# Patient Record
Sex: Female | Born: 1955 | Race: Black or African American | Hispanic: No | Marital: Single | Smoking: Never smoker
Health system: Southern US, Community
[De-identification: ages and names within clinical notes are randomized; demographics above are authoritative.]

## PROBLEM LIST (undated history)

## (undated) DIAGNOSIS — Z853 Personal history of malignant neoplasm of breast: Secondary | ICD-10-CM

## (undated) DIAGNOSIS — Z803 Family history of malignant neoplasm of breast: Secondary | ICD-10-CM

## (undated) HISTORY — DX: Personal history of malignant neoplasm of breast: Z85.3

## (undated) HISTORY — PX: MASTECTOMY: SHX3

## (undated) HISTORY — DX: Family history of malignant neoplasm of breast: Z80.3

---

## 2015-01-23 ENCOUNTER — Encounter (HOSPITAL_COMMUNITY): Payer: Self-pay | Admitting: Emergency Medicine

## 2015-01-23 ENCOUNTER — Emergency Department (HOSPITAL_COMMUNITY)
Admission: EM | Admit: 2015-01-23 | Discharge: 2015-01-24 | Disposition: A | Discharge status: Home / Self Care | Payer: BLUE CROSS/BLUE SHIELD | Attending: Emergency Medicine | Admitting: Emergency Medicine

## 2015-01-23 ENCOUNTER — Emergency Department (HOSPITAL_COMMUNITY): Payer: BLUE CROSS/BLUE SHIELD

## 2015-01-23 DIAGNOSIS — S0083XA Contusion of other part of head, initial encounter: Secondary | ICD-10-CM | POA: Diagnosis not present

## 2015-01-23 DIAGNOSIS — Y999 Unspecified external cause status: Secondary | ICD-10-CM | POA: Insufficient documentation

## 2015-01-23 DIAGNOSIS — Y9389 Activity, other specified: Secondary | ICD-10-CM | POA: Diagnosis not present

## 2015-01-23 DIAGNOSIS — Y92410 Unspecified street and highway as the place of occurrence of the external cause: Secondary | ICD-10-CM | POA: Insufficient documentation

## 2015-01-23 DIAGNOSIS — S0990XA Unspecified injury of head, initial encounter: Secondary | ICD-10-CM | POA: Diagnosis present
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
## 2015-01-24 MED ORDER — HYDROCODONE-ACETAMINOPHEN 5-325 MG PO TABS: 1.0000 | ORAL_TABLET | Freq: Four times a day (QID) | ORAL | Status: DC | PRN

## 2015-01-24 NOTE — Discharge Instructions (Signed)
Return here as needed. Follow up with your doctor. The CT scans only showed a hematoma of your face.
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
## 2021-07-25 ENCOUNTER — Other Ambulatory Visit: Payer: Self-pay

## 2021-07-25 ENCOUNTER — Emergency Department (HOSPITAL_COMMUNITY)
Admission: EM | Admit: 2021-07-25 | Discharge: 2021-07-25 | Disposition: A | Discharge status: Home / Self Care | Payer: BC Managed Care – PPO | Attending: Emergency Medicine | Admitting: Emergency Medicine

## 2021-07-25 ENCOUNTER — Emergency Department (HOSPITAL_COMMUNITY): Payer: BC Managed Care – PPO

## 2021-07-25 ENCOUNTER — Encounter (HOSPITAL_COMMUNITY): Payer: Self-pay | Admitting: Emergency Medicine

## 2021-07-25 DIAGNOSIS — S6992XA Unspecified injury of left wrist, hand and finger(s), initial encounter: Secondary | ICD-10-CM | POA: Diagnosis present

## 2021-07-25 DIAGNOSIS — Y92410 Unspecified street and highway as the place of occurrence of the external cause: Secondary | ICD-10-CM | POA: Insufficient documentation

## 2021-07-25 DIAGNOSIS — S65212A Laceration of superficial palmar arch of left hand, initial encounter: Secondary | ICD-10-CM | POA: Diagnosis not present

## 2021-07-25 DIAGNOSIS — S61412A Laceration without foreign body of left hand, initial encounter: Secondary | ICD-10-CM

## 2021-07-25 DIAGNOSIS — Z23 Encounter for immunization: Secondary | ICD-10-CM | POA: Diagnosis not present

## 2021-07-25 DIAGNOSIS — M25511 Pain in right shoulder: Secondary | ICD-10-CM | POA: Diagnosis not present

## 2021-07-25 MED ORDER — NAPROXEN 500 MG PO TABS: 500.0000 mg | ORAL_TABLET | Freq: Two times a day (BID) | ORAL | 0 refills | Status: DC

## 2021-07-25 MED ORDER — METHOCARBAMOL 500 MG PO TABS
500.0000 mg | ORAL_TABLET | Freq: Once | ORAL | Status: AC
  Administered 2021-07-25: 500 mg via ORAL
  Filled 2021-07-25: qty 1

## 2021-07-25 MED ORDER — LIDOCAINE HCL (PF) 1 % IJ SOLN
5.0000 mL | Freq: Once | INTRAMUSCULAR | Status: AC
  Administered 2021-07-25: 5 mL
  Filled 2021-07-25: qty 30

## 2021-07-25 MED ORDER — METHOCARBAMOL 500 MG PO TABS: 500.0000 mg | ORAL_TABLET | Freq: Two times a day (BID) | ORAL | 0 refills | Status: DC

## 2021-07-25 MED ORDER — LIDOCAINE-EPINEPHRINE-TETRACAINE (LET) TOPICAL GEL
3.0000 mL | Freq: Once | TOPICAL | Status: AC
  Administered 2021-07-25: 3 mL via TOPICAL
  Filled 2021-07-25: qty 3

## 2021-07-25 MED ORDER — TETANUS-DIPHTH-ACELL PERTUSSIS 5-2.5-18.5 LF-MCG/0.5 IM SUSY
0.5000 mL | PREFILLED_SYRINGE | Freq: Once | INTRAMUSCULAR | Status: AC
  Administered 2021-07-25: 0.5 mL via INTRAMUSCULAR
  Filled 2021-07-25: qty 0.5

## 2021-07-25 MED ORDER — LIDOCAINE 5 % EX PTCH: 1.0000 | MEDICATED_PATCH | CUTANEOUS | 0 refills | Status: DC

## 2021-07-25 NOTE — Progress Notes (Signed)
Orthopedic Tech Progress Note ?Patient Details:  ?Iliyah Bui ?1955-06-05 ?[id number] ? ?Ortho Devices ?Type of Ortho Device: Sling immobilizer ?Ortho Device/Splint Location: right ?Ortho Device/Splint Interventions: Application ?  ?Post Interventions ?Patient Tolerated: Well ?Instructions Provided: Care of device ? ?Saul Fordyce ?07/25/2021, 6:25 PM ? ?
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
## 2024-02-12 ENCOUNTER — Emergency Department (HOSPITAL_COMMUNITY)
Admission: EM | Admit: 2024-02-12 | Discharge: 2024-02-12 | Disposition: A | Discharge status: Home / Self Care | Attending: Emergency Medicine | Admitting: Emergency Medicine

## 2024-02-12 ENCOUNTER — Other Ambulatory Visit: Payer: Self-pay

## 2024-02-12 DIAGNOSIS — N644 Mastodynia: Secondary | ICD-10-CM | POA: Diagnosis present

## 2024-02-12 DIAGNOSIS — N61 Mastitis without abscess: Secondary | ICD-10-CM | POA: Diagnosis not present

## 2024-02-12 LAB — CBC WITH DIFFERENTIAL/PLATELET
Abs Immature Granulocytes: 0.03 K/uL (ref 0.00–0.07)
Basophils Absolute: 0 K/uL (ref 0.0–0.1)
Basophils Relative: 1 %
Eosinophils Absolute: 0.1 K/uL (ref 0.0–0.5)
Eosinophils Relative: 2 %
HCT: 37.2 % (ref 36.0–46.0)
Hemoglobin: 11.3 g/dL — ABNORMAL LOW (ref 12.0–15.0)
Immature Granulocytes: 0 %
Lymphocytes Relative: 25 %
Lymphs Abs: 2 K/uL (ref 0.7–4.0)
MCH: 22.5 pg — ABNORMAL LOW (ref 26.0–34.0)
MCHC: 30.4 g/dL (ref 30.0–36.0)
MCV: 74 fL — ABNORMAL LOW (ref 80.0–100.0)
Monocytes Absolute: 0.7 K/uL (ref 0.1–1.0)
Monocytes Relative: 9 %
Neutro Abs: 5 K/uL (ref 1.7–7.7)
Neutrophils Relative %: 63 %
Platelets: 184 K/uL (ref 150–400)
RBC: 5.03 MIL/uL (ref 3.87–5.11)
RDW: 17.1 % — ABNORMAL HIGH (ref 11.5–15.5)
WBC: 7.9 K/uL (ref 4.0–10.5)
nRBC: 0 % (ref 0.0–0.2)

## 2024-02-12 LAB — COMPREHENSIVE METABOLIC PANEL WITH GFR
ALT: 13 U/L (ref 0–44)
AST: 38 U/L (ref 15–41)
Albumin: 3.9 g/dL (ref 3.5–5.0)
Alkaline Phosphatase: 59 U/L (ref 38–126)
Anion gap: 13 (ref 5–15)
BUN: 22 mg/dL (ref 8–23)
CO2: 21 mmol/L — ABNORMAL LOW (ref 22–32)
Calcium: 8.8 mg/dL — ABNORMAL LOW (ref 8.9–10.3)
Chloride: 105 mmol/L (ref 98–111)
Creatinine, Ser: 0.76 mg/dL (ref 0.44–1.00)
GFR, Estimated: >60 mL/min (ref >60)
Glucose, Bld: 97 mg/dL (ref 70–99)
Potassium: 4.6 mmol/L (ref 3.5–5.1)
Sodium: 138 mmol/L (ref 135–145)
Total Bilirubin: 0.6 mg/dL (ref 0.0–1.2)
Total Protein: 7.4 g/dL (ref 6.5–8.1)

## 2024-02-12 MED ORDER — SODIUM CHLORIDE 0.9 % IV SOLN
1.0000 g | Freq: Once | INTRAVENOUS | Status: AC
  Administered 2024-02-12: 1 g via INTRAVENOUS
  Filled 2024-02-12: qty 10

## 2024-02-12 MED ORDER — DOXYCYCLINE HYCLATE 100 MG PO CAPS: 100.0000 mg | ORAL_CAPSULE | Freq: Two times a day (BID) | ORAL | 0 refills | Status: DC

## 2024-02-12 NOTE — ED Triage Notes (Signed)
 Pt reports left breast pain and swelling. Pt reports falling August 20th and was seen at [facility] but did not have the breast pain at that time.

## 2024-02-12 NOTE — Discharge Instructions (Signed)
 Return if any problems.  Schedule to see Dr. Ebbie for evaluation.  Call the breast center to be seen for evaluation

## 2024-02-12 NOTE — ED Provider Notes (Signed)
  EMERGENCY DEPARTMENT AT [facility] Provider Note   CSN: [id number] Arrival date & time: 02/12/24  1849     Patient presents with: Breast Pain   Whitney Villegas is a 68 y.o. female.   Patient complains of swelling and discomfort to her left breast.  Patient states that she noticed swelling a week ago.  Patient reports that she had a fall in August but struck the right side of her chest.  Patient complains of redness to the left breast.  Patient states that she has not had any fever or chills.  Patient has had a right sided mastectomy.  Patient states she had this in her 45s.  Patient reports having normal mammograms.  She believes her last mammogram was a year ago.  The history is provided by the patient. No language interpreter was used.       Prior to Admission medications   Medication Sig Start Date End Date Taking? Authorizing Provider  doxycycline  (VIBRAMYCIN ) 100 MG capsule Take 1 capsule (100 mg total) by mouth 2 (two) times daily. 02/12/24  Yes Trayvon Trumbull K, PA-C  HYDROcodone -acetaminophen  (NORCO/VICODIN) 5-325 MG per tablet Take 1 tablet by mouth every 6 (six) hours as needed for moderate pain. 01/24/15   Lawyer, Lonni, PA-C  ibuprofen (ADVIL,MOTRIN) 200 MG tablet Take 600 mg by mouth every 6 (six) hours as needed for moderate pain.    [provider]  lidocaine  (LIDODERM ) 5 % Place 1 patch onto the skin daily. Remove & Discard patch within 12 hours or as directed by MD 07/25/21   Henderly, Britni A, PA-C  methocarbamol  (ROBAXIN ) 500 MG tablet Take 1 tablet (500 mg total) by mouth 2 (two) times daily. 07/25/21   Henderly, Britni A, PA-C  naproxen  (NAPROSYN ) 500 MG tablet Take 1 tablet (500 mg total) by mouth 2 (two) times daily. 07/25/21   Henderly, Britni A, PA-C    Allergies: Patient has no known allergies.    Review of Systems  Constitutional:  Negative for fever.  HENT:  Negative for sore throat.   All other systems reviewed and are  negative.   Updated Vital Signs BP 136/75   Pulse 65   Temp 97.8 F (36.6 C) (Oral)   Resp 18   SpO2 100%   Physical Exam Vitals and nursing note reviewed.  Constitutional:      Appearance: She is well-developed.  HENT:     Head: Normocephalic.  Cardiovascular:     Rate and Rhythm: Normal rate.  Pulmonary:     Effort: Pulmonary effort is normal.  Abdominal:     General: There is no distension.  Musculoskeletal:        General: Normal range of motion.     Cervical back: Normal range of motion.  Skin:    General: Skin is warm.     Comments: Erythematous edematous left breast swelling to nipple area, large firm area to palpation.   Neurological:     General: No focal deficit present.     Mental Status: She is alert and oriented to person, place, and time.     (all labs ordered are listed, but only abnormal results are displayed) Labs Reviewed  CBC WITH DIFFERENTIAL/PLATELET - Abnormal; Notable for the following components:      Result Value   Hemoglobin 11.3 (*)    MCV 74.0 (*)    MCH 22.5 (*)    RDW 17.1 (*)    All other components within normal limits  COMPREHENSIVE  METABOLIC PANEL WITH GFR - Abnormal; Notable for the following components:   CO2 21 (*)    Calcium 8.8 (*)    All other components within normal limits    EKG: None  Radiology: No results found.   Procedures   Medications Ordered in the ED  cefTRIAXone  (ROCEPHIN ) 1 g in sodium chloride  0.9 % 100 mL IVPB (0 g Intravenous Stopped 02/12/24 2117)                                    Medical Decision Making Amount and/or Complexity of Data Reviewed Labs: ordered.    Details: Labs ordered reviewed and interpreted hemoglobin is 11.3 white blood cell count is normal  Risk Prescription drug management. Risk Details: Patient given IV Rocephin .  Patient given  doxycycline .  I sent a message to Dr. Ebbie who agrees to see patient for follow-up.  Patient is given Dr. Gelene phone number as  well as the number for the breast center.  She is advised to return to the emergency department if symptoms worsen or change.        Final diagnoses:  Cellulitis of breast    ED Discharge Orders          Ordered    doxycycline  (VIBRAMYCIN ) 100 MG capsule  2 times daily        02/12/24 2150           An After Visit Summary was printed and given to the patient.     Flint Sonny POUR, PA-C 02/12/24 2303    Bari Roxie HERO, DO 02/20/24 2238

## 2024-02-13 ENCOUNTER — Other Ambulatory Visit: Payer: Self-pay | Admitting: General Surgery

## 2024-02-13 DIAGNOSIS — N611 Abscess of the breast and nipple: Secondary | ICD-10-CM

## 2024-02-15 ENCOUNTER — Other Ambulatory Visit: Payer: Self-pay | Admitting: General Surgery

## 2024-02-15 ENCOUNTER — Ambulatory Visit
Admission: RE | Admit: 2024-02-15 | Discharge: 2024-02-15 | Disposition: A | Discharge status: Home / Self Care | Source: Ambulatory Visit | Attending: General Surgery | Admitting: General Surgery

## 2024-02-15 ENCOUNTER — Ambulatory Visit
Admission: RE | Admit: 2024-02-15 | Discharge: 2024-02-15 | Disposition: A | Discharge status: Home / Self Care | Payer: Medicare (Managed Care) | Source: Ambulatory Visit | Attending: General Surgery | Admitting: General Surgery

## 2024-02-15 DIAGNOSIS — N6320 Unspecified lump in the left breast, unspecified quadrant: Secondary | ICD-10-CM

## 2024-02-15 DIAGNOSIS — N611 Abscess of the breast and nipple: Secondary | ICD-10-CM

## 2024-02-15 HISTORY — PX: BREAST BIOPSY: SHX20

## 2024-02-16 ENCOUNTER — Telehealth: Payer: Self-pay | Admitting: Hematology and Oncology

## 2024-02-16 LAB — SURGICAL PATHOLOGY

## 2024-02-16 NOTE — Telephone Encounter (Signed)
 Scheduled appointments per referral. Talked with the patient and she is aware of the appointment time and date as well as the address. Patient was informed to arrive 10-15 minutes prior with updated insurance information. Patient was also informed that two guests are permitted but needs to be at least 69 years of age. All questions were answered.

## 2024-02-19 ENCOUNTER — Other Ambulatory Visit: Payer: Self-pay | Admitting: General Surgery

## 2024-02-20 ENCOUNTER — Other Ambulatory Visit: Payer: Self-pay | Admitting: *Deleted

## 2024-02-20 ENCOUNTER — Other Ambulatory Visit: Payer: Self-pay | Admitting: General Surgery

## 2024-02-20 DIAGNOSIS — C50912 Malignant neoplasm of unspecified site of left female breast: Secondary | ICD-10-CM

## 2024-02-20 DIAGNOSIS — C50512 Malignant neoplasm of lower-outer quadrant of left female breast: Secondary | ICD-10-CM | POA: Insufficient documentation

## 2024-02-21 ENCOUNTER — Other Ambulatory Visit

## 2024-02-21 ENCOUNTER — Inpatient Hospital Stay: Payer: Medicare (Managed Care) | Admitting: Genetic Counselor

## 2024-02-21 ENCOUNTER — Inpatient Hospital Stay: Payer: Medicare (Managed Care)

## 2024-02-21 ENCOUNTER — Other Ambulatory Visit: Payer: Self-pay | Admitting: Genetic Counselor

## 2024-02-21 ENCOUNTER — Inpatient Hospital Stay: Payer: Medicare (Managed Care) | Attending: Hematology and Oncology | Admitting: Hematology and Oncology

## 2024-02-21 ENCOUNTER — Encounter: Payer: Self-pay | Admitting: Hematology and Oncology

## 2024-02-21 ENCOUNTER — Encounter

## 2024-02-21 VITALS — BP 143/66 | HR 77 | Temp 97.8°F | Resp 18 | Wt 242.8 lb

## 2024-02-21 DIAGNOSIS — C773 Secondary and unspecified malignant neoplasm of axilla and upper limb lymph nodes: Secondary | ICD-10-CM | POA: Insufficient documentation

## 2024-02-21 DIAGNOSIS — Z5189 Encounter for other specified aftercare: Secondary | ICD-10-CM | POA: Diagnosis not present

## 2024-02-21 DIAGNOSIS — Z9011 Acquired absence of right breast and nipple: Secondary | ICD-10-CM | POA: Diagnosis not present

## 2024-02-21 DIAGNOSIS — Z803 Family history of malignant neoplasm of breast: Secondary | ICD-10-CM | POA: Insufficient documentation

## 2024-02-21 DIAGNOSIS — Z853 Personal history of malignant neoplasm of breast: Secondary | ICD-10-CM

## 2024-02-21 DIAGNOSIS — Z79630 Long term (current) use of alkylating agent: Secondary | ICD-10-CM | POA: Insufficient documentation

## 2024-02-21 DIAGNOSIS — Z171 Estrogen receptor negative status [ER-]: Secondary | ICD-10-CM

## 2024-02-21 DIAGNOSIS — Z5111 Encounter for antineoplastic chemotherapy: Secondary | ICD-10-CM | POA: Insufficient documentation

## 2024-02-21 DIAGNOSIS — Z1732 Human epidermal growth factor receptor 2 negative status: Secondary | ICD-10-CM

## 2024-02-21 DIAGNOSIS — C50812 Malignant neoplasm of overlapping sites of left female breast: Secondary | ICD-10-CM

## 2024-02-21 DIAGNOSIS — Z79620 Long term (current) use of immunosuppressive biologic: Secondary | ICD-10-CM | POA: Insufficient documentation

## 2024-02-21 DIAGNOSIS — Z5112 Encounter for antineoplastic immunotherapy: Secondary | ICD-10-CM | POA: Insufficient documentation

## 2024-02-21 DIAGNOSIS — Z17421 Hormone receptor negative with human epidermal growth factor receptor 2 negative status: Secondary | ICD-10-CM | POA: Diagnosis not present

## 2024-02-21 DIAGNOSIS — Z1722 Progesterone receptor negative status: Secondary | ICD-10-CM | POA: Diagnosis not present

## 2024-02-21 DIAGNOSIS — C50512 Malignant neoplasm of lower-outer quadrant of left female breast: Secondary | ICD-10-CM

## 2024-02-21 LAB — GENETIC SCREENING ORDER

## 2024-02-21 NOTE — Progress Notes (Signed)
  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 Suspected inflammatory sub type due to redness and swelling. - Order breast MRI to assess tumor and lymph node involvement. - Order scans to evaluate for metastasis. - Schedule echocardiogram to assess cardiac function prior to chemotherapy. - Plan neoadjuvant chemotherapy with KEYNOTE 522 regimen, contingent on health status and scan results. - Delay port placement until after scan results confirm treatment plan. - Coordinate scans at [facility] per her preference.  Right breast cancer, status post mastectomy and chemotherapy Treated with mastectomy and single chemotherapy session 43 years ago. Details of past treatment unclear.  Genetic counseling Genetic testing considered due to potential second primary breast cancer. - Coordinate with genetic counseling for potential genetic testing.   HISTORY OF PRESENTING ILLNESS:  Whitney Villegas 68 y.o. female is here because of triple neg Breast cancer.  Discussed the use of AI scribe software for clinical note transcription with the patient, who gave verbal consent to proceed.  History of Present Illness Whitney Villegas is a 68 year old female who presents with swelling and hardness of the left breast. She was referred by Dr. Ebbie for evaluation of her breast condition.  She experienced swelling and hardness of the left breast following an incident where a trash can hit her chest. Initially, she had pain on the right side, but three days later, significant swelling developed in the left breast. She sought care at urgent care and received pain medication and cream. Subsequently, she visited the emergency  room where she was administered intravenous antibiotics and prescribed oral antibiotics.  Despite completing the antibiotic treatment, the swelling persists. Prior to the incident, she did not notice any abnormalities in her breast, which she checks regularly.  Her past medical history includes a mastectomy 43 years ago for a lump in the right breast, during which she received one chemotherapy treatment. She believes the mastectomy was performed by mistake as she was initially told only the lump would be removed. She does not recall her last mammogram but estimates it was about five years ago and has no history of regular mammograms.  She currently takes naproxen  for pain and has completed a course of antibiotics. She does not take any other regular medications. No significant medical conditions such as diabetes, high blood pressure, heart disease, or kidney problems.  Family history includes her sister having breast cancer at approximately age 52. Her daughter passed away from a heart attack at age 70, with a history of fluid buildup and heart failure.  She lives alone in [place] and works 36 hours a week. She is active, participating in church activities and catering. Her son lives in [place] and can assist her if needed.   All other systems were reviewed with the patient and are negative.  MEDICAL HISTORY:  No past medical history on file.  SURGICAL HISTORY: Past Surgical History:  Procedure Laterality Date   BREAST BIOPSY Left 02/15/2024   US  LT BREAST BX W LOC DEV 1ST LESION IMG BX SPEC US  GUIDE 02/15/2024 [facility] MAMMOGRAPHY   MASTECTOMY      SOCIAL HISTORY: Social History   Socioeconomic History   Marital status: Single    Spouse name: Not on file  Number of children: Not on file   Years of education: Not on file   Highest education level: Not on file  Occupational History   Not on file  Tobacco Use   Smoking status: Never   Smokeless tobacco: Not on file  Substance  and Sexual Activity   Alcohol use: No   Drug use: Not on file   Sexual activity: Not on file  Other Topics Concern   Not on file  Social History Narrative   Not on file   Social Drivers of Health   Financial Resource Strain: Not on file  Food Insecurity: Not on file  Transportation Needs: Not on file  Physical Activity: Not on file  Stress: Not on file  Social Connections: Not on file  Intimate Partner Violence: Not on file    FAMILY HISTORY: Sister had BC at 72. Personal history of ? Breast cancer in 20's  ALLERGIES:  has no known allergies.  MEDICATIONS:  Current Outpatient Medications  Medication Sig Dispense Refill   doxycycline  (VIBRAMYCIN ) 100 MG capsule Take 1 capsule (100 mg total) by mouth 2 (two) times daily. 20 capsule 0   HYDROcodone -acetaminophen  (NORCO/VICODIN) 5-325 MG per tablet Take 1 tablet by mouth every 6 (six) hours as needed for moderate pain. 15 tablet 0   ibuprofen (ADVIL,MOTRIN) 200 MG tablet Take 600 mg by mouth every 6 (six) hours as needed for moderate pain.     lidocaine  (LIDODERM ) 5 % Place 1 patch onto the skin daily. Remove & Discard patch within 12 hours or as directed by MD 30 patch 0   methocarbamol  (ROBAXIN ) 500 MG tablet Take 1 tablet (500 mg total) by mouth 2 (two) times daily. 20 tablet 0   naproxen  (NAPROSYN ) 500 MG tablet Take 1 tablet (500 mg total) by mouth 2 (two) times daily. 30 tablet 0   No current facility-administered medications for this visit.     PHYSICAL EXAMINATION: ECOG PERFORMANCE STATUS: 1 - Symptomatic but completely ambulatory  Vitals:   02/21/24 1137  BP: (!) 143/66  Pulse: 77  Resp: 18  Temp: 97.8 F (36.6 C)  SpO2: 99%   Filed Weights   02/21/24 1137  Weight: 242 lb 12.8 oz (110.1 kg)    GENERAL:alert, no distress and comfortable Large mass in the left breast occupying the lower portion of the breast with diffuse swelling all over the breast. Palpable left axillary adenopathy, not matted. CTA  bilaterally RRR No LE edema.  LABORATORY DATA:  I have reviewed the data as listed Lab Results  Component Value Date   WBC 7.9 02/12/2024   HGB 11.3 (L) 02/12/2024   HCT 37.2 02/12/2024   MCV 74.0 (L) 02/12/2024   PLT 184 02/12/2024     Chemistry      Component Value Date/Time   NA 138 02/12/2024 2017   K 4.6 02/12/2024 2017   CL 105 02/12/2024 2017   CO2 21 (L) 02/12/2024 2017   BUN 22 02/12/2024 2017   CREATININE 0.76 02/12/2024 2017      Component Value Date/Time   CALCIUM 8.8 (L) 02/12/2024 2017   ALKPHOS 59 02/12/2024 2017   AST 38 02/12/2024 2017   ALT 13 02/12/2024 2017   BILITOT 0.6 02/12/2024 2017       RADIOGRAPHIC STUDIES: I have personally reviewed the radiological images as listed and agreed with the findings in the report. US  AXILLARY NODE CORE BIOPSY LEFT Addendum Date: 02/16/2024 ADDENDUM REPORT: 02/16/2024 13:29 ADDENDUM: PATHOLOGY revealed: Site 1. Breast, left,  needle core biopsy, 6 o'clock 3.7 cm masslike area INVASIVE MAMMARY CARCINOMA, OVERALL GRADE: 3; LYMPHOVASCULAR INVASION: NOT IDENTIFIED CANCER LENGTH: 0.9 CM CALCIFICATIONS: NOT IDENTIFIED OTHER FINDINGS: NONE Pathology results are CONCORDANT with imaging findings, per Dr. Reyes Phi. PATHOLOGY revealed: Site 2. Lymph node, needle/core biopsy, left axillary node DUCTAL CARCINOMA CONSISTENT WITH METASTASIS NO RESIDUAL LYMPH NODE TISSUE PRESENT. Pathology results are CONCORDANT with imaging findings, per Dr. Reyes Phi. Pathology results and recommendations below were discussed with patient by telephone on 02/16/24 by Rock Hover RN. Patient reported biopsy site within normal limits with slight tenderness at the site. Post biopsy care instructions were reviewed, questions were answered and my direct phone number was provided to patient. Patient was instructed to call [facility] if any concerns or questions arise related to the biopsy. RECOMMENDATIONS: 1. Surgical and oncological  consultation. Request for surgical consultation with Dr. Donnice Bury, per patient request, relayed to Landry Finger at [facility] on 02/16/2024 by Rock Hover RN. 2. Consider pretreatment bilateral breast MRI with and without contrast, and possible skin punch biopsy, if breast conservation is pursued. Pathology results reported by Rock Hover RN on 02/16/2024. Electronically Signed   By: Reyes Phi M.D.   On: 02/16/2024 13:29   Result Date: 02/16/2024 CLINICAL DATA:  68 year old female presents for ultrasound-guided biopsies of a 3.7 cm heterogeneous masslike area within the LOWER LEFT breast and an abnormal LEFT axillary lymph node. EXAM: ULTRASOUND GUIDED LEFT BREAST CORE NEEDLE BIOPSY US  AXILLARY NODE CORE BIOPSY LEFT COMPARISON:  Previous exam(s). PROCEDURE: I met with the patient and we discussed the procedure of ultrasound-guided biopsy, including benefits and alternatives. We discussed the high likelihood of a successful procedure. We discussed the risks of the procedure, including infection, bleeding, tissue injury, clip migration, and inadequate sampling. Informed written consent was given. The usual time-out protocol was performed immediately prior to the procedure. ULTRASOUND GUIDED LEFT BREAST CORE NEEDLE BIOPSY Using sterile technique and 1% Lidocaine  with and without epinephrine  as local anesthetic, under direct ultrasound visualization, a 12 gauge spring-loaded device was used to perform biopsy of the 3.7 cm heterogeneous masslike area at the 6 o'clock position using a MEDIAL approach. At the conclusion of the procedure a RIBBON shaped tissue marker clip was deployed into the biopsy cavity. Follow up 2 view mammogram was performed and dictated separately. US  AXILLARY NODE CORE BIOPSY LEFT Using sterile technique and 1% Lidocaine  with and without epinephrine  as local anesthetic, under direct ultrasound visualization, a 14 gauge spring-loaded device was used to perform biopsy of 1 of  the abnormal LEFT axillary lymph nodes using a MEDIAL approach. At the conclusion of the procedure HydroMARK spiral tissue marker clip was deployed into the biopsy cavity. Follow up 2 view mammogram was performed and dictated separately. IMPRESSION: Ultrasound guided biopsy of 3.7 cm anterior LOWER LEFT breast heterogeneous masslike area. Ultrasound-guided biopsy of abnormal LEFT axillary lymph node. No apparent complications. Electronically Signed: By: Reyes Phi M.D. On: 02/15/2024 09:48   US  LT BREAST BX W LOC DEV 1ST LESION IMG BX SPEC US  GUIDE Addendum Date: 02/16/2024 ADDENDUM REPORT: 02/16/2024 13:29 ADDENDUM: PATHOLOGY revealed: Site 1. Breast, left, needle core biopsy, 6 o'clock 3.7 cm masslike area INVASIVE MAMMARY CARCINOMA, OVERALL GRADE: 3; LYMPHOVASCULAR INVASION: NOT IDENTIFIED CANCER LENGTH: 0.9 CM CALCIFICATIONS: NOT IDENTIFIED OTHER FINDINGS: NONE Pathology results are CONCORDANT with imaging findings, per Dr. Reyes Phi. PATHOLOGY revealed: Site 2. Lymph node, needle/core biopsy, left axillary node DUCTAL CARCINOMA CONSISTENT WITH METASTASIS  NO RESIDUAL LYMPH NODE TISSUE PRESENT. Pathology results are CONCORDANT with imaging findings, per Dr. Reyes Phi. Pathology results and recommendations below were discussed with patient by telephone on 02/16/24 by Rock Hover RN. Patient reported biopsy site within normal limits with slight tenderness at the site. Post biopsy care instructions were reviewed, questions were answered and my direct phone number was provided to patient. Patient was instructed to call [facility] if any concerns or questions arise related to the biopsy. RECOMMENDATIONS: 1. Surgical and oncological consultation. Request for surgical consultation with Dr. Donnice Bury, per patient request, relayed to Landry Finger at [facility] on 02/16/2024 by Rock Hover RN. 2. Consider pretreatment bilateral breast MRI with and without contrast, and  possible skin punch biopsy, if breast conservation is pursued. Pathology results reported by Rock Hover RN on 02/16/2024. Electronically Signed   By: Reyes Phi M.D.   On: 02/16/2024 13:29   Result Date: 02/16/2024 CLINICAL DATA:  68 year old female presents for ultrasound-guided biopsies of a 3.7 cm heterogeneous masslike area within the LOWER LEFT breast and an abnormal LEFT axillary lymph node. EXAM: ULTRASOUND GUIDED LEFT BREAST CORE NEEDLE BIOPSY US  AXILLARY NODE CORE BIOPSY LEFT COMPARISON:  Previous exam(s). PROCEDURE: I met with the patient and we discussed the procedure of ultrasound-guided biopsy, including benefits and alternatives. We discussed the high likelihood of a successful procedure. We discussed the risks of the procedure, including infection, bleeding, tissue injury, clip migration, and inadequate sampling. Informed written consent was given. The usual time-out protocol was performed immediately prior to the procedure. ULTRASOUND GUIDED LEFT BREAST CORE NEEDLE BIOPSY Using sterile technique and 1% Lidocaine  with and without epinephrine  as local anesthetic, under direct ultrasound visualization, a 12 gauge spring-loaded device was used to perform biopsy of the 3.7 cm heterogeneous masslike area at the 6 o'clock position using a MEDIAL approach. At the conclusion of the procedure a RIBBON shaped tissue marker clip was deployed into the biopsy cavity. Follow up 2 view mammogram was performed and dictated separately. US  AXILLARY NODE CORE BIOPSY LEFT Using sterile technique and 1% Lidocaine  with and without epinephrine  as local anesthetic, under direct ultrasound visualization, a 14 gauge spring-loaded device was used to perform biopsy of 1 of the abnormal LEFT axillary lymph nodes using a MEDIAL approach. At the conclusion of the procedure HydroMARK spiral tissue marker clip was deployed into the biopsy cavity. Follow up 2 view mammogram was performed and dictated separately. IMPRESSION:  Ultrasound guided biopsy of 3.7 cm anterior LOWER LEFT breast heterogeneous masslike area. Ultrasound-guided biopsy of abnormal LEFT axillary lymph node. No apparent complications. Electronically Signed: By: Reyes Phi M.D. On: 02/15/2024 09:48   MM CLIP PLACEMENT LEFT Result Date: 02/15/2024 CLINICAL DATA:  Evaluate placement of biopsy clips following ultrasound-guided LEFT breast/axillary biopsies. EXAM: 3D DIAGNOSTIC LEFT MAMMOGRAM POST ULTRASOUND BIOPSY COMPARISON:  Previous exam(s). ACR Breast Density Category c: The breast is heterogeneously dense, which may obscure small masses. FINDINGS: 3D Mammographic images were obtained following ultrasound guided biopsy of the 3.6 cm heterogeneous masslike area at the 6 o'clock position and 1 of the abnormal LEFT axillary lymph nodes. The RIBBON biopsy marking clip is in expected position at the site of biopsy in the anterior LOWER LEFT breast. The HydroMARK spiral biopsy clip is in expected position within the biopsied LEFT axillary lymph node. IMPRESSION: Appropriate positioning of the RIBBON shaped biopsy marking clip at the site of biopsy in the anterior LOWER LEFT breast. Appropriate positioning of the [facility]  spiral shaped biopsy marking clip in the biopsied LEFT axillary lymph node. Final Assessment: Post Procedure Mammograms for Marker Placement Electronically Signed   By: Reyes Phi M.D.   On: 02/15/2024 09:44   MM 3D DIAGNOSTIC MAMMOGRAM UNILATERAL LEFT BREAST Result Date: 02/15/2024 CLINICAL DATA:  68 year old female presents with diffuse LEFT breast pain redness and thickening, recently placed on antibiotics with no change. History of RIGHT mastectomy 35-40 years ago for uncertain reason. EXAM: DIGITAL DIAGNOSTIC UNILATERAL LEFT MAMMOGRAM WITH TOMOSYNTHESIS AND CAD; ULTRASOUND LEFT BREAST LIMITED TECHNIQUE: Left digital diagnostic mammography and breast tomosynthesis was performed. The images were evaluated with computer-aided detection. ; Targeted  ultrasound examination of the left breast was performed. COMPARISON:  Previous exam(s). ACR Breast Density Category c: The breasts are heterogeneously dense, which may obscure small masses. FINDINGS: A focal asymmetry within the RETROAREOLAR LEFT breast is noted. A larger area of focal asymmetry within the middle to posterior depth OUTER LEFT breast identified. There is diffuse LEFT breast skin thickening and prominent LEFT axillary lymph nodes. No suspicious calcifications are noted. On physical exam, dimpled thickened discolored skin is noted throughout much of the LEFT breast with peau d'orange appearance. Diffuse firmness throughout much of the anterior, central and LOWER LEFT breast is identified. A LEFT nipple wound is noted. Targeted ultrasound is performed, showing diffuse heterogeneity within the RETROAREOLAR LEFT breast with a more focal discrete masslike area measuring 2.9 x 1.7 x 3.7 cm at the 6 o'clock position 3 cm from the nipple. Diffuse skin thickening is noted. Three abnormal enlarged LEFT axillary lymph nodes loss of hila noted. IMPRESSION: 1. Findings highly suspicious for LEFT breast cancer (inflammatory), with skin thickening and 3 abnormal LEFT axillary lymph nodes. Tissue sampling of the more focal heterogeneous masslike area in the LOWER LEFT breast and 1 of the abnormal lymph nodes is recommended. RECOMMENDATION: Ultrasound-guided biopsy of LOWER LEFT breast mass and an abnormal LEFT axillary lymph node, which will be performed today but dictated in a separate report. I have discussed the findings and recommendations with the patient. If applicable, a reminder letter will be sent to the patient regarding the next appointment. BI-RADS CATEGORY  5: Highly suggestive of malignancy. Electronically Signed   By: Reyes Phi M.D.   On: 02/15/2024 09:41   US  LIMITED ULTRASOUND INCLUDING AXILLA LEFT BREAST  Result Date: 02/15/2024 CLINICAL DATA:  68 year old female presents with diffuse LEFT  breast pain redness and thickening, recently placed on antibiotics with no change. History of RIGHT mastectomy 35-40 years ago for uncertain reason. EXAM: DIGITAL DIAGNOSTIC UNILATERAL LEFT MAMMOGRAM WITH TOMOSYNTHESIS AND CAD; ULTRASOUND LEFT BREAST LIMITED TECHNIQUE: Left digital diagnostic mammography and breast tomosynthesis was performed. The images were evaluated with computer-aided detection. ; Targeted ultrasound examination of the left breast was performed. COMPARISON:  Previous exam(s). ACR Breast Density Category c: The breasts are heterogeneously dense, which may obscure small masses. FINDINGS: A focal asymmetry within the RETROAREOLAR LEFT breast is noted. A larger area of focal asymmetry within the middle to posterior depth OUTER LEFT breast identified. There is diffuse LEFT breast skin thickening and prominent LEFT axillary lymph nodes. No suspicious calcifications are noted. On physical exam, dimpled thickened discolored skin is noted throughout much of the LEFT breast with peau d'orange appearance. Diffuse firmness throughout much of the anterior, central and LOWER LEFT breast is identified. A LEFT nipple wound is noted. Targeted ultrasound is performed, showing diffuse heterogeneity within the RETROAREOLAR LEFT breast with a more focal discrete masslike area measuring 2.9  x 1.7 x 3.7 cm at the 6 o'clock position 3 cm from the nipple. Diffuse skin thickening is noted. Three abnormal enlarged LEFT axillary lymph nodes loss of hila noted. IMPRESSION: 1. Findings highly suspicious for LEFT breast cancer (inflammatory), with skin thickening and 3 abnormal LEFT axillary lymph nodes. Tissue sampling of the more focal heterogeneous masslike area in the LOWER LEFT breast and 1 of the abnormal lymph nodes is recommended. RECOMMENDATION: Ultrasound-guided biopsy of LOWER LEFT breast mass and an abnormal LEFT axillary lymph node, which will be performed today but dictated in a separate report. I have discussed  the findings and recommendations with the patient. If applicable, a reminder letter will be sent to the patient regarding the next appointment. BI-RADS CATEGORY  5: Highly suggestive of malignancy. Electronically Signed   By: Reyes Phi M.D.   On: 02/15/2024 09:41    All questions were answered. The patient knows to call the clinic with any problems, questions or concerns. I spent 45 minutes in the care of this patient including H and P, review of records, counseling and coordination of care.     Amber Stalls, MD 02/21/2024 12:03 PM

## 2024-02-22 ENCOUNTER — Encounter: Payer: Self-pay | Admitting: *Deleted

## 2024-02-22 ENCOUNTER — Encounter: Payer: Self-pay | Admitting: Genetic Counselor

## 2024-02-22 ENCOUNTER — Telehealth: Payer: Self-pay | Admitting: *Deleted

## 2024-02-22 DIAGNOSIS — Z803 Family history of malignant neoplasm of breast: Secondary | ICD-10-CM | POA: Insufficient documentation

## 2024-02-22 DIAGNOSIS — Z853 Personal history of malignant neoplasm of breast: Secondary | ICD-10-CM | POA: Insufficient documentation

## 2024-02-22 NOTE — Progress Notes (Signed)
 REFERRING PROVIDER: Loretha Ash, MD [address] [place],  [place] [address]  PRIMARY PROVIDER:  System, Provider Not In  PRIMARY REASON FOR VISIT:  1. Family history of breast cancer   2. History of breast cancer   3. Malignant neoplasm of lower-outer quadrant of left breast of female, estrogen receptor negative (HCC)      HISTORY OF PRESENT ILLNESS:   [name], a 68 y.o. female, was seen for a [place] cancer genetics consultation at the request of Dr. Loretha due to a personal and family history of breast cancer.  [name] presents to clinic today to discuss the possibility of a hereditary predisposition to cancer, genetic testing, and to further clarify her future cancer risks, as well as potential cancer risks for family members.   At the age of 25, [name] was diagnosed with cancer of the right breast. The treatment plan included a single mastectomy.  In September 2025, at the age of 58, [name] was diagnosed with cancer in her left breast.     CANCER HISTORY:  Oncology History   No history exists.     RISK FACTORS:  Menarche was at age 74.  First live birth at age 69.  OCP use for approximately 2 years.  Ovaries intact: yes.  Hysterectomy: no.  Menopausal status: postmenopausal.  HRT use: 0 years. Colonoscopy: no; not examined. Mammogram within the last year: yes. Number of breast biopsies: 2. Up to date with pelvic exams: n/a. Any excessive radiation exposure in the past: no  Past Medical History:  Diagnosis Date   Family history of breast cancer    History of breast cancer     Past Surgical History:  Procedure Laterality Date   BREAST BIOPSY Left 02/15/2024   US  LT BREAST BX W LOC DEV 1ST LESION IMG BX SPEC US  GUIDE 02/15/2024 [facility] MAMMOGRAPHY   MASTECTOMY      Social History   Socioeconomic History   Marital status: Single    Spouse name: Not on file   Number of children: Not on file   Years of education: Not on file   Highest education  level: Not on file  Occupational History   Not on file  Tobacco Use   Smoking status: Never   Smokeless tobacco: Not on file  Substance and Sexual Activity   Alcohol use: No   Drug use: Not on file   Sexual activity: Not on file  Other Topics Concern   Not on file  Social History Narrative   Not on file   Social Drivers of Health   Financial Resource Strain: Not on file  Food Insecurity: Not on file  Transportation Needs: Not on file  Physical Activity: Not on file  Stress: Not on file  Social Connections: Not on file     FAMILY HISTORY:  We obtained a detailed, 4-generation family history.  Significant diagnoses are listed below: No family history on file.    The patient has a sister with breast cancer who died at 22.  She has a sister and three brothers who are cancer free.  There was no other reported family history of cancer.  [name] is unaware of previous family history of genetic testing for hereditary cancer risks. There is no reported Ashkenazi Jewish ancestry. There is no known consanguinity.  GENETIC COUNSELING ASSESSMENT: [name] is a 68 y.o. female with a personal and family history of breast cancer which is somewhat suggestive of a hereditary cancer syndrome  and predisposition to cancer given her very young age of diagnosis and also a second diagnosis of breast cancer. We, therefore, discussed and recommended the following at today's visit.   DISCUSSION: We discussed that, in general, most cancer is not inherited in families, but instead is sporadic or familial. Sporadic cancers occur by chance and typically happen at older ages (>50 years) as this type of cancer is caused by genetic changes acquired during an individual's lifetime. Some families have more cancers than would be expected by chance; however, the ages or types of cancer are not consistent with a known genetic mutation or known genetic mutations have been ruled out. This type of familial cancer is  thought to be due to a combination of multiple genetic, environmental, hormonal, and lifestyle factors. While this combination of factors likely increases the risk of cancer, the exact source of this risk is not currently identifiable or testable.  We discussed that 5 - 10% of breast cancer is hereditary, with most cases associated with BRCA mutations.  There are other genes that can be associated with hereditary breast cancer syndromes.  These include ATM, CHEK2 and PALB2.  We discussed that testing is beneficial for several reasons including knowing how to follow individuals after completing their treatment, identifying whether potential treatment options such as PARP inhibitors would be beneficial, and understand if other family members could be at risk for cancer and allow them to undergo genetic testing.   We reviewed the characteristics, features and inheritance patterns of hereditary cancer syndromes. We also discussed genetic testing, including the appropriate family members to test, the process of testing, insurance coverage and turn-around-time for results. We discussed the implications of a negative, positive, carrier and/or variant of uncertain significant result. [name]  was offered a common hereditary cancer panel (36+ genes) and an expanded pan-cancer panel (70+ genes). [name] was informed of the benefits and limitations of each panel, including that expanded pan-cancer panels contain genes that do not have clear management guidelines at this point in time.  We also discussed that as the number of genes included on a panel increases, the chances of variants of uncertain significance increases. Whitney Villegas decided to pursue genetic testing for the Common hereditary cancer panel + RNA gene panel.   The Common Hereditary Gene Panel offered by [facility] includes sequencing and/or deletion duplication testing of the following 48 genes: APC, ATM, AXIN2, BAP1, BARD1, BMPR1A, BRCA1, BRCA2, BRIP1,  CDH1, CDK4, CDKN2A (p14ARF), CDKN2A (p16INK4a), CHEK2, CTNNA1, DICER1, EPCAM (Deletion/duplication testing only), GREM1 (promoter region deletion/duplication testing only), KIT, MEN1, MLH1, MSH2, MSH3, MSH6, MUTYH, NBN, NF1, NHTL1, PALB2, PDGFRA, PMS2, POLD1, POLE, PTEN, RAD50, RAD51C, RAD51D, SDHB, SDHC, SDHD, SMAD4, SMARCA4. STK11, TP53, TSC1, TSC2, and VHL.  The following genes were evaluated for sequence changes only: SDHA and HOXB13 c.251G>A variant only.   Based on [name] personal and family history of cancer, she meets medical criteria for genetic testing. Despite that she meets criteria, she may still have an out of pocket cost. We discussed that if her out of pocket cost for testing is over $100, the laboratory will call and confirm whether she wants to proceed with testing.  If the out of pocket cost of testing is less than $100 she will be billed by the genetic testing laboratory.   PLAN: After considering the risks, benefits, and limitations, [name] provided informed consent to pursue genetic testing and the blood sample was sent to [facility] for analysis of the Common  Hereditary Cancer Panel. Results should be available within approximately 2-3 weeks' time, at which point they will be disclosed by telephone to [name], as will any additional recommendations warranted by these results. [name] will receive a summary of her genetic counseling visit and a copy of her results once available. This information will also be available in Epic.   Lastly, we encouraged [name] to remain in contact with cancer genetics annually so that we can continuously update the family history and inform her of any changes in cancer genetics and testing that may be of benefit for this family.   [name] questions were answered to her satisfaction today. Our contact information was provided should additional questions or concerns arise. Thank you for the referral and allowing us  to share in  the care of your patient.   Halley Whitney Villegas P. Perri, MS, CGC Licensed, Patent attorney [email or web address] phone: [phone]  In total, 60 minutes were spent on the date of the encounter in service to the patient including preparation, face-to-face consultation, documentation and care coordination.  The patient was seen alone.  Drs. Lanny Stalls, and/or Gudena were available for questions, if needed..    _______________________________________________________________________ For Office Staff:  Number of people involved in session: 1 Was an Intern/ student involved with case: no

## 2024-02-22 NOTE — Telephone Encounter (Signed)
 Late entry from 10/1 - Spoke with patient to introduce navigation resources and give contact information.  Discussed with patient the need  to try to get the scans prior to her port being placed on 10/8 if possible.  Patient states she can only come certain days/times.Informed her we may have to cancel port placement until we can get her scan results. Patient verbalized understanding.

## 2024-02-23 ENCOUNTER — Ambulatory Visit (HOSPITAL_COMMUNITY)
Admission: RE | Admit: 2024-02-23 | Discharge: 2024-02-23 | Disposition: A | Discharge status: Home / Self Care | Payer: Medicare (Managed Care) | Source: Ambulatory Visit | Attending: Hematology and Oncology | Admitting: Hematology and Oncology

## 2024-02-23 ENCOUNTER — Encounter: Payer: Self-pay | Admitting: General Surgery

## 2024-02-23 DIAGNOSIS — I3481 Nonrheumatic mitral (valve) annulus calcification: Secondary | ICD-10-CM | POA: Diagnosis not present

## 2024-02-23 DIAGNOSIS — C50512 Malignant neoplasm of lower-outer quadrant of left female breast: Secondary | ICD-10-CM | POA: Diagnosis not present

## 2024-02-23 DIAGNOSIS — Z171 Estrogen receptor negative status [ER-]: Secondary | ICD-10-CM | POA: Diagnosis not present

## 2024-02-23 LAB — ECHOCARDIOGRAM COMPLETE
AV Mean grad: 4 mmHg
AV Peak grad: 7 mmHg
Ao pk vel: 1.32 m/s
Area-P 1/2: 3.27 cm2
S' Lateral: 2.9 cm

## 2024-02-25 ENCOUNTER — Ambulatory Visit
Admission: RE | Admit: 2024-02-25 | Discharge: 2024-02-25 | Disposition: A | Discharge status: Home / Self Care | Payer: Medicare (Managed Care) | Source: Ambulatory Visit | Attending: General Surgery | Admitting: General Surgery

## 2024-02-25 DIAGNOSIS — C50912 Malignant neoplasm of unspecified site of left female breast: Secondary | ICD-10-CM

## 2024-02-25 MED ORDER — GADOPICLENOL 0.5 MMOL/ML IV SOLN
10.0000 mL | Freq: Once | INTRAVENOUS | Status: AC | PRN
  Administered 2024-02-25: 10 mL via INTRAVENOUS

## 2024-02-26 ENCOUNTER — Other Ambulatory Visit: Payer: Self-pay

## 2024-02-26 ENCOUNTER — Encounter (HOSPITAL_COMMUNITY): Payer: Self-pay | Admitting: General Surgery

## 2024-02-26 NOTE — Progress Notes (Signed)
 SDW CALL  Patient was given pre-op instructions over the phone. The opportunity was given for the patient to ask questions. No further questions asked. Patient verbalized understanding of instructions given.   PCP - denies Cardiologist -denies   PPM/ICD - denies   Chest x-ray - denies EKG - denies Stress Test - denies ECHO - 02/23/24 Cardiac Cath - denies  Sleep Study - denies  No DM  Last dose of GLP1 agonist-  n/a GLP1 instructions: n/a  Blood Thinner Instructions: n/a Aspirin Instructions: n/a  ERAS Protcol - clears until 0530 PRE-SURGERY Ensure or G2- n/a  COVID TEST- n/a   Anesthesia review: no  Patient denies shortness of breath, fever, cough and chest pain over the phone call   All instructions explained to the patient, with a verbal understanding of the material. Patient agrees to go over the instructions while at home for a better understanding.

## 2024-02-27 ENCOUNTER — Encounter (HOSPITAL_COMMUNITY)
Admission: RE | Admit: 2024-02-27 | Discharge: 2024-02-27 | Disposition: A | Discharge status: Home / Self Care | Payer: Medicare (Managed Care) | Source: Ambulatory Visit | Attending: Hematology and Oncology | Admitting: Hematology and Oncology

## 2024-02-27 ENCOUNTER — Other Ambulatory Visit: Payer: Self-pay | Admitting: General Surgery

## 2024-02-27 ENCOUNTER — Ambulatory Visit (HOSPITAL_COMMUNITY)
Admission: RE | Admit: 2024-02-27 | Discharge: 2024-02-27 | Disposition: A | Discharge status: Home / Self Care | Payer: Medicare (Managed Care) | Source: Ambulatory Visit | Attending: Hematology and Oncology | Admitting: Hematology and Oncology

## 2024-02-27 DIAGNOSIS — Z171 Estrogen receptor negative status [ER-]: Secondary | ICD-10-CM | POA: Insufficient documentation

## 2024-02-27 DIAGNOSIS — C50512 Malignant neoplasm of lower-outer quadrant of left female breast: Secondary | ICD-10-CM | POA: Insufficient documentation

## 2024-02-27 MED ORDER — SODIUM CHLORIDE (PF) 0.9 % IJ SOLN
INTRAMUSCULAR | Status: AC
  Filled 2024-02-27: qty 50

## 2024-02-27 MED ORDER — IOHEXOL 300 MG/ML  SOLN
100.0000 mL | Freq: Once | INTRAMUSCULAR | Status: AC | PRN
  Administered 2024-02-27: 100 mL via INTRAVENOUS

## 2024-02-27 MED ORDER — TECHNETIUM TC 99M MEDRONATE IV KIT
20.0000 | PACK | Freq: Once | INTRAVENOUS | Status: AC | PRN
  Administered 2024-02-27: 19 via INTRAVENOUS

## 2024-02-28 ENCOUNTER — Ambulatory Visit (HOSPITAL_COMMUNITY)
Admission: RE | Admit: 2024-02-28 | Discharge: 2024-02-28 | Disposition: A | Discharge status: Home / Self Care | Payer: Medicare (Managed Care) | Attending: General Surgery | Admitting: General Surgery

## 2024-02-28 ENCOUNTER — Encounter (HOSPITAL_COMMUNITY): Payer: Self-pay | Admitting: General Surgery

## 2024-02-28 ENCOUNTER — Ambulatory Visit (HOSPITAL_COMMUNITY): Payer: Medicare (Managed Care)

## 2024-02-28 ENCOUNTER — Other Ambulatory Visit: Payer: Self-pay

## 2024-02-28 ENCOUNTER — Ambulatory Visit (HOSPITAL_COMMUNITY): Payer: Medicare (Managed Care) | Admitting: Anesthesiology

## 2024-02-28 ENCOUNTER — Encounter (HOSPITAL_COMMUNITY)
Admission: RE | Disposition: A | Discharge status: Home / Self Care | Payer: Self-pay | Source: Home / Self Care | Attending: General Surgery

## 2024-02-28 ENCOUNTER — Other Ambulatory Visit: Payer: Self-pay | Admitting: Hematology and Oncology

## 2024-02-28 DIAGNOSIS — C50A2 Malignant inflammatory neoplasm of left breast: Secondary | ICD-10-CM | POA: Insufficient documentation

## 2024-02-28 DIAGNOSIS — C773 Secondary and unspecified malignant neoplasm of axilla and upper limb lymph nodes: Secondary | ICD-10-CM | POA: Diagnosis not present

## 2024-02-28 DIAGNOSIS — Z87891 Personal history of nicotine dependence: Secondary | ICD-10-CM

## 2024-02-28 DIAGNOSIS — Z6835 Body mass index (BMI) 35.0-35.9, adult: Secondary | ICD-10-CM | POA: Insufficient documentation

## 2024-02-28 DIAGNOSIS — Z9011 Acquired absence of right breast and nipple: Secondary | ICD-10-CM | POA: Diagnosis not present

## 2024-02-28 DIAGNOSIS — E669 Obesity, unspecified: Secondary | ICD-10-CM | POA: Diagnosis not present

## 2024-02-28 DIAGNOSIS — I10 Essential (primary) hypertension: Secondary | ICD-10-CM

## 2024-02-28 DIAGNOSIS — C50912 Malignant neoplasm of unspecified site of left female breast: Secondary | ICD-10-CM | POA: Diagnosis not present

## 2024-02-28 HISTORY — PX: PORTACATH PLACEMENT: SHX2246

## 2024-02-28 SURGERY — INSERTION, TUNNELED CENTRAL VENOUS DEVICE, WITH PORT
Anesthesia: General | Site: Chest

## 2024-02-28 MED ORDER — CHLORHEXIDINE GLUCONATE 0.12 % MT SOLN
OROMUCOSAL | Status: AC
  Administered 2024-02-28: 15 mL
  Filled 2024-02-28: qty 15

## 2024-02-28 MED ORDER — KETOROLAC TROMETHAMINE 30 MG/ML IJ SOLN
INTRAMUSCULAR | Status: DC | PRN
Start: 2024-02-28 — End: 2024-02-28
  Administered 2024-02-28: 15 mg via INTRAVENOUS

## 2024-02-28 MED ORDER — HEPARIN 6000 UNIT IRRIGATION SOLUTION
Status: DC | PRN
  Administered 2024-02-28: 1

## 2024-02-28 MED ORDER — OXYCODONE HCL 5 MG/5ML PO SOLN: 5.0000 mg | Freq: Once | ORAL | Status: DC | PRN

## 2024-02-28 MED ORDER — DEXAMETHASONE SODIUM PHOSPHATE 10 MG/ML IJ SOLN
INTRAMUSCULAR | Status: DC | PRN
  Administered 2024-02-28: 10 mg via INTRAVENOUS

## 2024-02-28 MED ORDER — CHLORHEXIDINE GLUCONATE CLOTH 2 % EX PADS: 6.0000 | MEDICATED_PAD | Freq: Once | CUTANEOUS | Status: DC

## 2024-02-28 MED ORDER — FENTANYL CITRATE (PF) 250 MCG/5ML IJ SOLN
INTRAMUSCULAR | Status: AC
  Filled 2024-02-28: qty 5

## 2024-02-28 MED ORDER — PHENYLEPHRINE 80 MCG/ML (10ML) SYRINGE FOR IV PUSH (FOR BLOOD PRESSURE SUPPORT)
PREFILLED_SYRINGE | INTRAVENOUS | Status: DC | PRN
  Administered 2024-02-28 (×3): 80 ug via INTRAVENOUS

## 2024-02-28 MED ORDER — OXYCODONE HCL 5 MG PO TABS: 5.0000 mg | ORAL_TABLET | Freq: Once | ORAL | Status: DC | PRN

## 2024-02-28 MED ORDER — BUPIVACAINE HCL 0.25 % IJ SOLN
INTRAMUSCULAR | Status: DC | PRN
Start: 2024-02-28 — End: 2024-02-28
  Administered 2024-02-28: 10 mL

## 2024-02-28 MED ORDER — FENTANYL CITRATE (PF) 250 MCG/5ML IJ SOLN
INTRAMUSCULAR | Status: DC | PRN
  Administered 2024-02-28 (×2): 50 ug via INTRAVENOUS
  Administered 2024-02-28: 100 ug via INTRAVENOUS

## 2024-02-28 MED ORDER — CEFAZOLIN SODIUM-DEXTROSE 2-4 GM/100ML-% IV SOLN
INTRAVENOUS | Status: AC
  Filled 2024-02-28: qty 100

## 2024-02-28 MED ORDER — CHLORHEXIDINE GLUCONATE CLOTH 2 % EX PADS
6.0000 | MEDICATED_PAD | Freq: Once | CUTANEOUS | Status: DC
Start: 2024-02-28 — End: 2024-02-28

## 2024-02-28 MED ORDER — ACETAMINOPHEN 500 MG PO TABS
ORAL_TABLET | ORAL | Status: AC
  Administered 2024-02-28: 1000 mg via ORAL
  Filled 2024-02-28: qty 2

## 2024-02-28 MED ORDER — ONDANSETRON HCL 4 MG/2ML IJ SOLN: 4.0000 mg | Freq: Once | INTRAMUSCULAR | Status: DC | PRN

## 2024-02-28 MED ORDER — LIDOCAINE 2% (20 MG/ML) 5 ML SYRINGE
INTRAMUSCULAR | Status: DC | PRN
  Administered 2024-02-28: 60 mg via INTRAVENOUS

## 2024-02-28 MED ORDER — CEFAZOLIN SODIUM-DEXTROSE 2-4 GM/100ML-% IV SOLN
2.0000 g | INTRAVENOUS | Status: AC
  Administered 2024-02-28: 2 g via INTRAVENOUS

## 2024-02-28 MED ORDER — PROPOFOL 10 MG/ML IV BOLUS
INTRAVENOUS | Status: DC | PRN
  Administered 2024-02-28: 200 mg via INTRAVENOUS

## 2024-02-28 MED ORDER — ACETAMINOPHEN 500 MG PO TABS: 1000.0000 mg | ORAL_TABLET | ORAL | Status: AC

## 2024-02-28 MED ORDER — ENSURE PRE-SURGERY PO LIQD: 296.0000 mL | Freq: Once | ORAL | Status: DC

## 2024-02-28 MED ORDER — 0.9 % SODIUM CHLORIDE (POUR BTL) OPTIME
TOPICAL | Status: DC | PRN
  Administered 2024-02-28: 1000 mL

## 2024-02-28 MED ORDER — ONDANSETRON HCL 4 MG/2ML IJ SOLN
INTRAMUSCULAR | Status: DC | PRN
  Administered 2024-02-28: 4 mg via INTRAVENOUS

## 2024-02-28 MED ORDER — LACTATED RINGERS IV SOLN: INTRAVENOUS | Status: DC | PRN

## 2024-02-28 MED ORDER — MIDAZOLAM HCL 2 MG/2ML IJ SOLN
INTRAMUSCULAR | Status: DC | PRN
  Administered 2024-02-28: 2 mg via INTRAVENOUS

## 2024-02-28 MED ORDER — FENTANYL CITRATE (PF) 100 MCG/2ML IJ SOLN: 25.0000 ug | INTRAMUSCULAR | Status: DC | PRN

## 2024-02-28 MED ORDER — ONDANSETRON HCL 4 MG/2ML IJ SOLN
INTRAMUSCULAR | Status: AC
  Filled 2024-02-28: qty 2

## 2024-02-28 MED ORDER — DEXAMETHASONE SODIUM PHOSPHATE 10 MG/ML IJ SOLN
INTRAMUSCULAR | Status: AC
Start: 2024-02-28 — End: 2024-02-28
  Filled 2024-02-28: qty 1

## 2024-02-28 MED ORDER — BUPIVACAINE HCL (PF) 0.25 % IJ SOLN
INTRAMUSCULAR | Status: AC
  Filled 2024-02-28: qty 30

## 2024-02-28 MED ORDER — HEPARIN SOD (PORK) LOCK FLUSH 100 UNIT/ML IV SOLN
INTRAVENOUS | Status: AC
  Filled 2024-02-28: qty 5

## 2024-02-28 MED ORDER — PROPOFOL 10 MG/ML IV BOLUS
INTRAVENOUS | Status: AC
  Filled 2024-02-28: qty 20

## 2024-02-28 MED ORDER — HEPARIN SOD (PORK) LOCK FLUSH 100 UNIT/ML IV SOLN
INTRAVENOUS | Status: DC | PRN
  Administered 2024-02-28: 500 [IU]

## 2024-02-28 MED ORDER — AMISULPRIDE (ANTIEMETIC) 5 MG/2ML IV SOLN: 10.0000 mg | Freq: Once | INTRAVENOUS | Status: DC | PRN

## 2024-02-28 MED ORDER — HEPARIN 6000 UNIT IRRIGATION SOLUTION
Status: AC
  Filled 2024-02-28: qty 500

## 2024-02-28 MED ORDER — MIDAZOLAM HCL 2 MG/2ML IJ SOLN
INTRAMUSCULAR | Status: AC
  Filled 2024-02-28: qty 2

## 2024-02-28 MED ORDER — ACETAMINOPHEN 500 MG PO TABS: 1000.0000 mg | ORAL_TABLET | Freq: Once | ORAL | Status: AC

## 2024-02-28 MED ORDER — LIDOCAINE 2% (20 MG/ML) 5 ML SYRINGE
INTRAMUSCULAR | Status: AC
  Filled 2024-02-28: qty 5

## 2024-02-28 SURGICAL SUPPLY — 37 items
BAG COUNTER SPONGE SURGICOUNT (BAG) ×1 IMPLANT
BAG DECANTER FOR FLEXI CONT (MISCELLANEOUS) ×1 IMPLANT
CHLORAPREP W/TINT 26 (MISCELLANEOUS) ×1 IMPLANT
COVER PROBE W GEL 5X96 (DRAPES) ×1 IMPLANT
COVER SURGICAL LIGHT HANDLE (MISCELLANEOUS) ×1 IMPLANT
DERMABOND ADVANCED .7 DNX12 (GAUZE/BANDAGES/DRESSINGS) ×1 IMPLANT
DRAPE C-ARM 42X120 X-RAY (DRAPES) ×1 IMPLANT
DRAPE CHEST BREAST 15X10 FENES (DRAPES) ×1 IMPLANT
ELECT CAUTERY BLADE 6.4 (BLADE) ×1 IMPLANT
ELECTRODE REM PT RTRN 9FT ADLT (ELECTROSURGICAL) ×1 IMPLANT
GAUZE 4X4 16PLY ~~LOC~~+RFID DBL (SPONGE) ×1 IMPLANT
GEL ULTRASOUND 20GR AQUASONIC (MISCELLANEOUS) ×1 IMPLANT
GLOVE BIO SURGEON STRL SZ7 (GLOVE) ×1 IMPLANT
GLOVE BIOGEL PI IND STRL 7.5 (GLOVE) ×1 IMPLANT
GOWN STRL REUS W/ TWL LRG LVL3 (GOWN DISPOSABLE) ×2 IMPLANT
INTRODUCER COOK 11FR (CATHETERS) IMPLANT
KIT BASIN OR (CUSTOM PROCEDURE TRAY) ×1 IMPLANT
KIT PORT POWER 8FR ISP CVUE (Port) ×1 IMPLANT
KIT TURNOVER KIT B (KITS) ×1 IMPLANT
PAD ARMBOARD POSITIONER FOAM (MISCELLANEOUS) ×2 IMPLANT
PENCIL BUTTON HOLSTER BLD 10FT (ELECTRODE) ×1 IMPLANT
POSITIONER HEAD DONUT 9IN (MISCELLANEOUS) ×1 IMPLANT
SET INTRODUCER 12FR PACEMAKER (INTRODUCER) IMPLANT
SET SHEATH INTRODUCER 10FR (MISCELLANEOUS) IMPLANT
SHEATH COOK PEEL AWAY SET 9F (SHEATH) IMPLANT
SOLN 0.9% NACL 1000 ML (IV SOLUTION) ×1 IMPLANT
SOLN 0.9% NACL POUR BTL 1000ML (IV SOLUTION) ×1 IMPLANT
SPIKE FLUID TRANSFER (MISCELLANEOUS) ×1 IMPLANT
STRIP CLOSURE SKIN 1/2X4 (GAUZE/BANDAGES/DRESSINGS) ×1 IMPLANT
SUT MNCRL AB 4-0 PS2 18 (SUTURE) ×1 IMPLANT
SUT PROLENE 2 0 SH DA (SUTURE) ×1 IMPLANT
SUT SILK 2-0 18XBRD TIE 12 (SUTURE) IMPLANT
SUT VIC AB 3-0 SH 27XBRD (SUTURE) ×1 IMPLANT
SYR 5ML LUER SLIP (SYRINGE) ×1 IMPLANT
TOWEL GREEN STERILE (TOWEL DISPOSABLE) ×1 IMPLANT
TOWEL GREEN STERILE FF (TOWEL DISPOSABLE) ×1 IMPLANT
TRAY LAPAROSCOPIC MC (CUSTOM PROCEDURE TRAY) ×1 IMPLANT

## 2024-02-28 NOTE — Anesthesia Procedure Notes (Signed)
 Procedure Name: LMA Insertion Date/Time: 02/28/2024 8:43 AM  Performed by: Loreli Blima LABOR, CRNAPre-anesthesia Checklist: Patient identified, Emergency Drugs available, Suction available and Patient being monitored Patient Re-evaluated:Patient Re-evaluated prior to induction Oxygen Delivery Method: Circle System Utilized Preoxygenation: Pre-oxygenation with 100% oxygen Induction Type: IV induction Ventilation: Mask ventilation without difficulty LMA: LMA inserted LMA Size: 4.0 Number of attempts: 1 Placement Confirmation: positive ETCO2 Tube secured with: Tape Dental Injury: Teeth and Oropharynx as per pre-operative assessment

## 2024-02-28 NOTE — Transfer of Care (Signed)
 Immediate Anesthesia Transfer of Care Note  Patient: Whitney Villegas  Procedure(s) Performed: INSERTION, TUNNELED CENTRAL VENOUS DEVICE, WITH PORT (Chest)  Patient Location: PACU  Anesthesia Type:General  Level of Consciousness: drowsy  Airway & Oxygen Therapy: Patient Spontanous Breathing  Post-op Assessment: Report given to RN and Post -op Vital signs reviewed and stable  Post vital signs: Reviewed and stable  Last Vitals:  Vitals Value Taken Time  BP 130/71 02/28/24 09:45  Temp 36.5 C 02/28/24 09:32  Pulse 71 02/28/24 09:45  Resp 17 02/28/24 09:45  SpO2 92 % 02/28/24 09:45  Vitals shown include unfiled device data.  Last Pain:  Vitals:   02/28/24 0932  TempSrc:   PainSc: 0-No pain         Complications: No notable events documented.

## 2024-02-28 NOTE — Discharge Instructions (Signed)
 PORT-A-CATH: POST OP INSTRUCTIONS  Always review your discharge instruction sheet given to you by the facility where your surgery was performed.   A prescription for pain medication may be given to you upon discharge. Take your pain medication as prescribed, if needed. If narcotic pain medicine is not needed, then you make take acetaminophen  (Tylenol ) or ibuprofen (Advil) as needed.  Take your usually prescribed medications unless otherwise directed. If you need a refill on your pain medication, please contact our office. All narcotic pain medicine now requires a paper prescription.  Phoned in and fax refills are no longer allowed by law.  Prescriptions will not be filled after 5 pm or on weekends.  You should follow a light diet for the remainder of the day after your procedure. Most patients will experience some mild swelling and/or bruising in the area of the incision. It may take several days to resolve. It is common to experience some constipation if taking pain medication after surgery. Increasing fluid intake and taking a stool softener (such as Colace) will usually help or prevent this problem from occurring. A mild laxative (Milk of Magnesia or Miralax) should be taken according to package directions if there are no bowel movements after 48 hours.  Unless discharge instructions indicate otherwise, you may remove your bandages 48 hours after surgery, and you may shower at that time. You may have steri-strips (small white skin tapes) in place directly over the incision.  These strips should be left on the skin for 7-10 days.  If your surgeon used Dermabond (skin glue) on the incision, you may shower in 24 hours.  The glue will flake off over the next 2-3 weeks.  If your port is left accessed at the end of surgery (needle left in port), the dressing cannot get wet and should only by changed by a healthcare professional. When the port is no longer accessed (when the needle has been removed), follow  step 7.   ACTIVITIES:  Limit activity involving your arms for the next 72 hours. Do no strenuous exercise or activity for 1 week. You may drive when you are no longer taking prescription pain medication, you can comfortably wear a seatbelt, and you can maneuver your car. 10.You may need to see your doctor in the office for a follow-up appointment.  Please       check with your doctor.  11.When you receive a new Port-a-Cath, you will get a product guide and        ID card.  Please keep them in case you need them.  WHEN TO CALL YOUR DOCTOR [phone]): Fever over 101.0 Chills Continued bleeding from incision Increased redness and tenderness at the site Shortness of breath, difficulty breathing   The clinic staff is available to answer your questions during regular business hours. Please don't hesitate to call and ask to speak to one of the nurses or medical assistants for clinical concerns. If you have a medical emergency, go to the nearest emergency room or call 911.  A surgeon from [facility] is always on call at the hospital.     For further information, please visit [email or web address]

## 2024-02-28 NOTE — Anesthesia Postprocedure Evaluation (Signed)
 Anesthesia Post Note  Patient: Whitney Villegas  Procedure(s) Performed: INSERTION, TUNNELED CENTRAL VENOUS DEVICE, WITH PORT (Chest)     Patient location during evaluation: PACU Anesthesia Type: General Level of consciousness: awake and alert, oriented and patient cooperative Pain management: pain level controlled Vital Signs Assessment: post-procedure vital signs reviewed and stable Respiratory status: spontaneous breathing, nonlabored ventilation and respiratory function stable Cardiovascular status: blood pressure returned to baseline and stable Postop Assessment: no apparent nausea or vomiting Anesthetic complications: no   No notable events documented.  Last Vitals:  Vitals:   02/28/24 0945 02/28/24 1000  BP: 130/71 (!) 143/70  Pulse: 71 62  Resp: 18 13  Temp:    SpO2: 92% 97%    Last Pain:  Vitals:   02/28/24 1000  TempSrc:   PainSc: 0-No pain                 [name]

## 2024-02-28 NOTE — Anesthesia Preprocedure Evaluation (Addendum)
 Anesthesia Evaluation  Patient identified by MRN, date of birth, ID band Patient awake    Reviewed: Allergy & Precautions, H&P , NPO status , Patient's Chart, lab work & pertinent test results  Airway Mallampati: III  TM Distance: >3 FB Neck ROM: Full    Dental  (+) Edentulous Upper, Poor Dentition, Chipped, Dental Advisory Given   Pulmonary former smoker   Pulmonary exam normal breath sounds clear to auscultation       Cardiovascular hypertension (140/75 preop, no home meds), Normal cardiovascular exam Rhythm:Regular Rate:Normal     Neuro/Psych negative neurological ROS  negative psych ROS   GI/Hepatic negative GI ROS, Neg liver ROS,,,  Endo/Other  Obesity BMI 36  Renal/GU negative Renal ROS  negative genitourinary   Musculoskeletal negative musculoskeletal ROS (+)    Abdominal  (+) + obese  Peds negative pediatric ROS (+)  Hematology   Anesthesia Other Findings L breast ca  Reproductive/Obstetrics negative OB ROS                              Anesthesia Physical Anesthesia Plan  ASA: 3  Anesthesia Plan: General   Post-op Pain Management: Tylenol  PO (pre-op)*   Induction: Intravenous  PONV Risk Score and Plan: 3 and Ondansetron, Dexamethasone, Midazolam and Treatment may vary due to age or medical condition  Airway Management Planned: LMA  Additional Equipment: None  Intra-op Plan:   Post-operative Plan: Extubation in OR  Informed Consent: I have reviewed the patients History and Physical, chart, labs and discussed the procedure including the risks, benefits and alternatives for the proposed anesthesia with the patient or authorized representative who has indicated his/her understanding and acceptance.     Dental advisory given  Plan Discussed with: CRNA  Anesthesia Plan Comments:          Anesthesia Quick Evaluation

## 2024-02-28 NOTE — Op Note (Signed)
  Preoperative diagnosis: inflammatory left breast cancer Postoperative diagnosis: Same as above Procedure: Right internal jugular vein port placement Surgical Dr Adina Bury Anesthesia: General Estimated blood loss: Minimal Complications: Drains: None Sponge needle count was correct completion Disposition recovery stable condition   Indications:  73 yof with left breast inflammatory cancer. Here for port placement to begin chemotherapy.    Procedure: After informed consent was obtained the patient was taken the operating room.  She was given antibiotics.  SCDs were in place.  She was placed under general anesthesia without complication.  She was prepped and draped in the standard sterile surgical fashion.  A surgical timeout was then performed.   I used the ultrasound to identify the right internal jugular vein.  I made a small nick in the skin.  I accessed the vein under direct vision with a needle.  I then passed the wire.  The wire was confirmed to be in position both with the ultrasound and fluoroscopy.  I then infiltrated Marcaine over the right chest.  I made incision and created a pocket for the port.  I tunneled the line between the 2 sites.  I then placed the dilator and sheath over the wire and watched this under fluoroscopy go into position.  The wire assembly was then removed.  I then passed the line down the sheath.  The sheath was removed.  I then pulled the line back so that the distal end of the line was in the distal vena cava at cavoatrial junction.  This line is now ready for use.  I then attached the port and placed this in the pocket.  I sutured it into position with a Prolene suture.  I then closed this with 3-0 Vicryl for Monocryl.  Glue was placed on the incisions.  I was able to access the port easily.  It flushed easily and withdrew blood.  I placed heparin in it.she tolerated this well and was transferred to pacu stable.

## 2024-02-28 NOTE — H&P (Signed)
 36 yof with prior right mastectomy for what sounds like breast cancer in her 25s. She had what sounds like an axillary mass noted at that time. She was taken to the OR and woke up with a mastectomy. States was given one chemotherapy treatment and then was ok after that. She is otherwise healthy. 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 She has had recent left breast swelling that she had evaluated in the ER. She states no redness, bra size has not changed. She has had mm now that shows that she has c density tissue. She has a focal asymmetry in the retroareolar left breast and a larger area in mid-posterior depth outer left breast. There is diffuse skin thickening and prominent left axillary nodes. US  shows a 2.9x1.7x3.7 cm mass in the left breast with diffuse skin thickening. There are 3 abnormal left axillary nodes. Biopsy of breast mass and node was done. The node is positive. 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 She is here to discuss options.   Review of Systems: A complete review of systems was obtained from the patient. I have reviewed this information and discussed as appropriate with the patient. See HPI as well for other ROS.  Review of Systems  All other systems reviewed and are negative.  Medical History: Past Medical History:  Diagnosis Date  History of cancer   Past Surgical History:  Procedure Laterality Date  MASTECTOMY   No Known Allergies  Current Outpatient Medications on File Prior to Visit  Medication Sig Dispense Refill  doxycycline  (VIBRAMYCIN ) 100 MG capsule Take 100 mg by mouth 2 (two) times daily   History reviewed. No pertinent family history.   Social History   Tobacco Use  Smoking Status Former  Types: Cigarettes  Smokeless Tobacco Never  Marital status: Single  Tobacco Use  Smoking status: Former  Types: Cigarettes  Smokeless tobacco: Never   Objective:   Vitals:  02/19/24 1528  BP: (!) 136/90  Pulse: 84  Weight: (!) 111.4 kg (245 lb 9.6 oz)  Height:  175.3 cm (5' 9)   Body mass index is 36.27 kg/m.  Physical Exam Vitals reviewed.  Constitutional:  Appearance: Normal appearance.  Chest:  Breasts: Right: No mass.  Left: Mass and skin change present. No inverted nipple or nipple discharge.  Comments: S/p right mastectomy Left with peau d'orange changes Lymphadenopathy:  Upper Body:  Right upper body: No supraclavicular or axillary adenopathy.  Left upper body: No supraclavicular or axillary adenopathy.  Neurological:  Mental Status: She is alert.   Assessment and Plan:   Breast cancer metastasized to axillary lymph node, left (CMS/HHS-HCC)  Port, will need staging scans, genetics, MRI breast, oncology appt this Wednesday  We discussed the staging and pathophysiology of breast cancer. We discussed all of the different options for treatment for breast cancer including surgery, chemotherapy, radiation therapy.  Discussed aggressive nature of cancer and need for chemotherapy soon. We discussed port placement today with likely eventual breast surgery if does not have stage IV disease. Will plan port placement asap Will get mri breast in case does need breast surgery down the road to assess chemotherapy response. She will see oncology Wednesday and then will proceed from there

## 2024-02-29 ENCOUNTER — Encounter (HOSPITAL_COMMUNITY): Payer: Self-pay | Admitting: General Surgery

## 2024-02-29 ENCOUNTER — Encounter: Payer: Self-pay | Admitting: Genetic Counselor

## 2024-02-29 ENCOUNTER — Encounter: Payer: Self-pay | Admitting: *Deleted

## 2024-02-29 ENCOUNTER — Telehealth: Payer: Self-pay | Admitting: Genetic Counselor

## 2024-02-29 DIAGNOSIS — Z1379 Encounter for other screening for genetic and chromosomal anomalies: Secondary | ICD-10-CM | POA: Insufficient documentation

## 2024-02-29 NOTE — Telephone Encounter (Signed)
 Patient answered but was at work.  Asked that we call back later.

## 2024-03-01 ENCOUNTER — Ambulatory Visit: Payer: Self-pay | Admitting: Genetic Counselor

## 2024-03-01 ENCOUNTER — Inpatient Hospital Stay: Payer: Medicare (Managed Care) | Admitting: Licensed Clinical Social Worker

## 2024-03-01 DIAGNOSIS — C50512 Malignant neoplasm of lower-outer quadrant of left female breast: Secondary | ICD-10-CM

## 2024-03-01 DIAGNOSIS — Z1379 Encounter for other screening for genetic and chromosomal anomalies: Secondary | ICD-10-CM

## 2024-03-01 NOTE — Progress Notes (Signed)
 [facility] Clinical Social Work  Initial Assessment   Whitney Villegas is a 68 y.o. year old female contacted by phone. Clinical Social Work was referred by new patient protocol for assessment of psychosocial needs.   SDOH (Social Determinants of Health) assessments performed: Yes SDOH Interventions    Flowsheet Row Clinical Support from 03/01/2024 in [facility] - A Dept Of [place]. [facility]  SDOH Interventions   Food Insecurity Interventions Intervention Not Indicated  Transportation Interventions Intervention Not Indicated  Utilities Interventions Intervention Not Indicated    SDOH Screenings   Food Insecurity: No Food Insecurity (03/01/2024)  Housing: Unknown (03/01/2024)  Transportation Needs: No Transportation Needs (03/01/2024)  Utilities: Not At Risk (03/01/2024)  Tobacco Use: Medium Risk (02/28/2024)    PHQ 2/9:     No data to display           Distress Screen completed: No     No data to display            Family/Social Information:  Housing Arrangement: patient lives alone Family members/support persons in your life? Family and Friends, including her son & his wife Transportation concerns: no  Employment: Working full time .  Income source: Employment Financial concerns: No Type of concern: None Food access concerns: no Religious or spiritual practice: Not known Advanced directives: Not known Services Currently in place:  [place] Medicare & BCBS  Coping/ Adjustment to diagnosis: Patient understands treatment plan and what happens next? Yes. Short call today as pt was at work Current coping skills/ strengths: Capable of independent living  and Supportive family/friends     SUMMARY: Current SDOH Barriers:  No major barriers identified today  Clinical Social Work Clinical Goal(s):  No clinical social work goals at this time  Interventions: Informed patient of the support team roles and support services at [facility] Encouraged  patient to call with any questions or concerns   Follow Up Plan: Patient will contact CSW with any support or resource needs Patient verbalizes understanding of plan: Yes    Kc Sedlak E Keirsten Matuska, LCSW Clinical Social Worker [facility]

## 2024-03-01 NOTE — Telephone Encounter (Signed)
 I contacted  Whitney Villegas to discuss her genetic testing results. No pathogenic variants were identified in the 77 genes analyzed. Discussed that we do not know why she has breast cancer or why there is cancer in the family. It could be due to a different gene that we are not testing, or maybe our current technology may not be able to pick something up.  It will be important for her to keep in contact with genetics to keep up with whether additional testing may be needed.Detailed clinic note to follow.  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  These will not change medical management.   The test report will be scanned into EPIC and will be located under the Molecular Pathology section of the Results Review tab.  A portion of the result report is included below for reference.

## 2024-03-01 NOTE — Progress Notes (Signed)
 HPI:  Whitney Villegas was previously seen in the [facility] due to a personal and family history of cancer and concerns regarding a hereditary predisposition to cancer. Please refer to our prior cancer genetics clinic note for more information regarding our discussion, assessment and recommendations, at the time. Whitney Villegas recent genetic test results were disclosed to her, as were recommendations warranted by these results. These results and recommendations are discussed in more detail below.  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 STK11, TP53, TSC1, TSC2, and VHL.  The following genes were evaluated for sequence changes only: SDHA and HOXB13 c.251G>A variant only.      FAMILY HISTORY:  We obtained a detailed, 4-generation family history.  Significant diagnoses are listed below: Family History  Problem Relation Age of Onset   Cirrhosis Father    Breast cancer Sister    Kidney failure Brother        The patient has a sister with breast cancer who died at 7.  She has a sister and three brothers who are cancer  free.  There was no other reported family history of cancer.   Ms. General is unaware of previous family history of genetic testing for hereditary cancer risks. There is no reported Ashkenazi Jewish ancestry. There is no known consanguinity  GENETIC TEST RESULTS: Genetic testing reported out on February 28, 2024 through the Common Hereditary Cancer + RNA cancer panel found no pathogenic mutations. The Common Hereditary Gene Panel offered by [facility] includes sequencing and/or deletion duplication testing of the following 48 genes: APC, ATM, AXIN2, BAP1, BARD1, BMPR1A, BRCA1, BRCA2, BRIP1, CDH1, CDK4, CDKN2A (p14ARF), CDKN2A (p16INK4a), CHEK2, CTNNA1, DICER1, EPCAM (Deletion/duplication testing only), GREM1 (promoter region deletion/duplication testing only), KIT, MEN1, MLH1, MSH2, MSH3, MSH6, MUTYH, NBN, NF1, NHTL1, PALB2, PDGFRA, PMS2, POLD1, POLE, PTEN, RAD50, RAD51C, RAD51D, SDHB, SDHC, SDHD, SMAD4, SMARCA4. STK11, TP53, TSC1, TSC2, and VHL.  The following genes were evaluated for sequence changes only: SDHA and HOXB13 c.251G>A variant only. The test report has been scanned into EPIC and is located under the Molecular Pathology section of the Results Review tab.  A portion of the result report is included below for reference.     We discussed with Whitney Villegas that because current genetic testing is not perfect, it is possible there may be a gene mutation in one of these genes that current testing cannot detect, but that chance is small.  We also discussed, that there could be another gene that has not yet been discovered, or that we have not yet tested, that is responsible for the cancer diagnoses in the family. It is also possible there is a hereditary cause for the cancer in the family that Whitney Villegas did not inherit and therefore was not identified in  her testing.  Therefore, it is important to remain in touch with cancer genetics in the future so that we can continue to offer Whitney Villegas the most up to date  genetic testing.   Genetic testing did identify two Variants of uncertain significance (VUS) - one in the PALB2 gene called c.1833C>A (p.Asp611Glu)  and a second in the POLE gene called c.5942A>G (p.Asn1981Ser).  At this time, it is unknown if these variants are associated with increased cancer risk or if they are normal findings, but most variants such as these get reclassified to being inconsequential. They should not be used to make medical management decisions. With time, we suspect the lab will determine the significance of these variants, if any. If we do learn more about them, we will try to contact Whitney Villegas to discuss it further. However, it is important to stay in touch with us  periodically and keep the address and phone number up to date.  ADDITIONAL GENETIC TESTING: We discussed with Whitney Villegas that her genetic testing was fairly extensive.  If there are genes identified to increase cancer risk that can be analyzed in the future, we would be happy to discuss and coordinate this testing at that time.    CANCER SCREENING RECOMMENDATIONS: Whitney Villegas test result is considered negative (normal).  This means that we have not identified a hereditary cause for her personal and family history of cancer at this time. Most cancers happen by chance and this negative test suggests that her personal and family history of cancer may fall into this category.    Possible reasons for Whitney Villegas negative genetic test include:  1. There may be a gene mutation in one of these genes that current testing methods cannot detect but that chance is small.  2. There could be another gene that has not yet been discovered, or that we have not yet tested, that is responsible for the cancer diagnoses in the family.  3.  There may be no hereditary risk for cancer in the family. The cancers in Whitney Villegas and/or her family may be sporadic/familial or due to other genetic and environmental factors. 4. It is also possible there  is a hereditary cause for the cancer in the family that Whitney Villegas did not inherit.  Therefore, it is recommended she continue to follow the cancer management and screening guidelines provided by her oncology and primary healthcare provider. An individual's cancer risk and medical management are not determined by genetic test results alone. Overall cancer risk assessment incorporates additional factors, including personal medical history, family history, and any available genetic information that may result in a personalized plan for cancer prevention and surveillance  RECOMMENDATIONS FOR FAMILY MEMBERS:   Since she did not inherit a identifiable mutation in a cancer predisposition gene included on this panel, her children could not have inherited a known mutation from her in one of these genes. Individuals in this family might be at some increased risk of developing cancer, over the general population risk, simply due to the family history of cancer.  We recommended women in this family have a yearly mammogram beginning at age 103, or 85 years younger than the earliest onset of cancer, an annual clinical breast exam, and perform monthly breast self-exams. Women in this family should also have a gynecological exam as recommended by their primary provider. All family members should be referred for colonoscopy starting at age 69, or 3 years younger than the earliest onset of cancer.  FOLLOW-UP: Lastly,  we discussed with Whitney Villegas that cancer genetics is a rapidly advancing field and it is possible that new genetic tests will be appropriate for her and/or her family members in the future. We encouraged her to remain in contact with cancer genetics on an annual basis so we can update her personal and family histories and let her know of advances in cancer genetics that may benefit this family.   Our contact number was provided. Whitney Villegas questions were answered to her satisfaction, and she knows she is  welcome to call us  at anytime with additional questions or concerns.   Darice Monte, MS, [facility] Licensed, Certified Genetic Counselor [email or web address]

## 2024-03-04 ENCOUNTER — Other Ambulatory Visit: Payer: Self-pay | Admitting: Hematology and Oncology

## 2024-03-04 ENCOUNTER — Other Ambulatory Visit: Payer: Self-pay | Admitting: Pharmacist

## 2024-03-04 NOTE — Progress Notes (Signed)
 START ON PATHWAY REGIMEN - Breast     Cycles 1 through 4: A cycle is every 21 days:     Pembrolizumab      Paclitaxel      Carboplatin      Filgrastim-xxxx    Cycles 5 through 8: A cycle is every 21 days:     Pembrolizumab      Doxorubicin      Cyclophosphamide      Pegfilgrastim-xxxx   **Always confirm dose/schedule in your pharmacy ordering system**  Patient Characteristics: Preoperative or Nonsurgical Candidate, M0 (Clinical Staging), Up to cT4c, Any N, M0, Neoadjuvant Therapy followed by Surgery, Invasive Disease, Chemotherapy, HER2 Negative, ER Negative, Platinum Therapy Indicated and Candidate for Checkpoint Inhibitor Therapeutic Status: Preoperative or Nonsurgical Candidate, M0 (Clinical Staging) AJCC M Category: cM0 AJCC Grade: G3 ER Status: Negative (-) AJCC 8 Stage Grouping: IIIC HER2 Status: Negative (-) AJCC T Category: cT4 AJCC N Category: cN3 PR Status: Negative (-) Breast Surgical Plan: Neoadjuvant Therapy followed by Surgery Intent of Therapy: Curative Intent, Discussed with Patient

## 2024-03-05 ENCOUNTER — Other Ambulatory Visit: Payer: Self-pay | Admitting: Hematology and Oncology

## 2024-03-05 ENCOUNTER — Other Ambulatory Visit: Payer: Self-pay

## 2024-03-05 ENCOUNTER — Telehealth: Payer: Self-pay

## 2024-03-05 DIAGNOSIS — C50512 Malignant neoplasm of lower-outer quadrant of left female breast: Secondary | ICD-10-CM

## 2024-03-05 NOTE — Progress Notes (Signed)
 Pharmacist Chemotherapy Monitoring - Initial Assessment    Anticipated start date: 03/12/24   The following has been reviewed per standard work regarding the patient's treatment regimen: The patient's diagnosis, treatment plan and drug doses, and organ/hematologic function Lab orders and baseline tests specific to treatment regimen  The treatment plan start date, drug sequencing, and pre-medications Prior authorization status  Patient's documented medication list, including drug-drug interaction screen and prescriptions for anti-emetics and supportive care specific to the treatment regimen The drug concentrations, fluid compatibility, administration routes, and timing of the medications to be used The patient's access for treatment and lifetime cumulative dose history, if applicable  The patient's medication allergies and previous infusion related reactions, if applicable   Changes made to treatment plan:  treatment plan date  Follow up needed:  Pending authorization for treatment Release of take-home antiemetics  Jule Whitsel R Presnell, RPH, 03/05/2024  1:08 PM

## 2024-03-05 NOTE — Telephone Encounter (Signed)
Spoke with patient and confirmed appointment for 10/15

## 2024-03-06 ENCOUNTER — Ambulatory Visit (HOSPITAL_COMMUNITY): Payer: Medicare (Managed Care)

## 2024-03-06 ENCOUNTER — Other Ambulatory Visit (HOSPITAL_COMMUNITY): Payer: Medicare (Managed Care)

## 2024-03-06 ENCOUNTER — Encounter (HOSPITAL_COMMUNITY): Payer: Medicare (Managed Care)

## 2024-03-06 ENCOUNTER — Inpatient Hospital Stay: Payer: Medicare (Managed Care)

## 2024-03-06 ENCOUNTER — Other Ambulatory Visit: Payer: Self-pay

## 2024-03-06 ENCOUNTER — Inpatient Hospital Stay: Payer: Medicare (Managed Care) | Admitting: Pharmacist

## 2024-03-06 ENCOUNTER — Inpatient Hospital Stay (HOSPITAL_BASED_OUTPATIENT_CLINIC_OR_DEPARTMENT_OTHER): Payer: Medicare (Managed Care) | Admitting: Hematology and Oncology

## 2024-03-06 VITALS — BP 142/72 | HR 74 | Temp 97.6°F | Resp 16 | Wt 239.4 lb

## 2024-03-06 DIAGNOSIS — C50512 Malignant neoplasm of lower-outer quadrant of left female breast: Secondary | ICD-10-CM

## 2024-03-06 DIAGNOSIS — Z171 Estrogen receptor negative status [ER-]: Secondary | ICD-10-CM | POA: Diagnosis not present

## 2024-03-06 DIAGNOSIS — Z5112 Encounter for antineoplastic immunotherapy: Secondary | ICD-10-CM | POA: Diagnosis not present

## 2024-03-06 MED ORDER — DEXAMETHASONE 4 MG PO TABS: ORAL_TABLET | ORAL | 1 refills | Status: DC

## 2024-03-06 MED ORDER — PROCHLORPERAZINE MALEATE 10 MG PO TABS: 10.0000 mg | ORAL_TABLET | Freq: Four times a day (QID) | ORAL | 1 refills | Status: DC | PRN

## 2024-03-06 MED ORDER — LIDOCAINE-PRILOCAINE 2.5-2.5 % EX CREA: TOPICAL_CREAM | CUTANEOUS | 3 refills | Status: AC

## 2024-03-06 MED ORDER — ONDANSETRON HCL 8 MG PO TABS: 8.0000 mg | ORAL_TABLET | Freq: Three times a day (TID) | ORAL | 1 refills | Status: DC | PRN

## 2024-03-06 NOTE — Progress Notes (Signed)
 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 Imaging showed suspicious mediastinal and internal mammary lymph nodes, indicating? distant metastasis. No visceral metastases. - We discussed that since there is no evidence of visceral mets, given very aggressive biology, despite superior mediastinal disease being non regional nodes, we agreed to give her the benefit of doubt and trying neoadj chemo. I am not sure if she is a good candidate for KEYNOTE 522 regimen. - Initiate carboplatin and docetaxel with keytruda every 21 days for six cycles. - Chemo class today for detailed discussion of chemo - Plan surgical intervention and radiation if significant tumor shrinkage. - She understands this treatment may not be curative. - She is agreeable to proceed   HISTORY OF PRESENTING ILLNESS:  Whitney Villegas 68 y.o. female is here because of triple neg Breast cancer.  Discussed the use of AI scribe software for clinical note transcription with the patient, who gave verbal consent to proceed.  History of Present Illness   Whitney Villegas is a 68 year old female with triple-negative breast cancer who presents with concerns about swelling and treatment options.  She has significant swelling in her left breast, describing it as feeling like a 'basketball.' Recent scans have revealed lymph node  involvement in the left armpit, mediastinum, and internal mammary lymph nodes. No oozing from the affected breast, and the size of the swelling has remained the same since her last observation. She has a port in place for treatment administration.  She is concerned about the impact of chemotherapy on her ability to work, as she currently works and is trying to coordinate treatment around her schedule. She inquires about the timing of chemotherapy sessions, expressing a preference for treatments that allow her to work as much as possible.   All other systems were reviewed with the patient and are negative.  MEDICAL HISTORY:  Past Medical History:  Diagnosis Date   Family history of breast cancer    History of breast cancer     SURGICAL HISTORY: Past Surgical History:  Procedure Laterality Date   BREAST BIOPSY Left 02/15/2024   US  LT BREAST BX W LOC DEV 1ST LESION IMG BX SPEC US  GUIDE 02/15/2024 [facility] MAMMOGRAPHY   MASTECTOMY     PORTACATH PLACEMENT N/A 02/28/2024   Procedure: INSERTION, TUNNELED CENTRAL VENOUS DEVICE, WITH PORT;  Surgeon: Ebbie Cough, MD;  Location: [facility] OR;  Service: General;  Laterality: N/A;  PORT PLACEMENT WITH ULTRASOUND GUIDANCE    SOCIAL HISTORY: Social History   Socioeconomic History   Marital status: Single    Spouse name: Not on file   Number of children: Not on file   Years of education: Not on file   Highest education level: Not on file  Occupational History   Not on file  Tobacco Use   Smoking status: Former    Types: Cigarettes   Smokeless tobacco: Not on file  Substance and Sexual Activity   Alcohol use: No   Drug  use: Never   Sexual activity: Not on file  Other Topics Concern   Not on file  Social History Narrative   Not on file   Social Drivers of Health   Financial Resource Strain: Not on file  Food Insecurity: No Food Insecurity (03/01/2024)   Hunger Vital Sign    Worried About Running Out of Food in the Last Year: Never true     Ran Out of Food in the Last Year: Never true  Transportation Needs: No Transportation Needs (03/01/2024)   PRAPARE - Administrator, Civil Service (Medical): No    Lack of Transportation (Non-Medical): No  Physical Activity: Not on file  Stress: Not on file  Social Connections: Not on file  Intimate Partner Violence: Not on file    FAMILY HISTORY: Sister had BC at 23. Personal history of ? Breast cancer in 20's  ALLERGIES:  has no known allergies.  MEDICATIONS:  Current Outpatient Medications  Medication Sig Dispense Refill   cholecalciferol (VITAMIN D3) 25 MCG (1000 UNIT) tablet Take 1,000 Units by mouth daily.     Multiple Vitamins-Minerals (MULTIVITAMIN WITH MINERALS) tablet Take 1 tablet by mouth daily. Centrum silver     No current facility-administered medications for this visit.     PHYSICAL EXAMINATION: ECOG PERFORMANCE STATUS: 1 - Symptomatic but completely ambulatory  Vitals:   03/06/24 1144  BP: (!) 142/72  Pulse: 74  Resp: 16  Temp: 97.6 F (36.4 C)  SpO2: 95%   Filed Weights   03/06/24 1144  Weight: 239 lb 6.4 oz (108.6 kg)    GENERAL:alert, no distress and comfortable Large mass in the left breast occupying the lower portion of the breast with diffuse swelling all over the breast. Bandaid on the nipple Palpable left axillary adenopathy, not matted. No LE edema.  LABORATORY DATA:  I have reviewed the data as listed Lab Results  Component Value Date   WBC 7.9 02/12/2024   HGB 11.3 (L) 02/12/2024   HCT 37.2 02/12/2024   MCV 74.0 (L) 02/12/2024   PLT 184 02/12/2024     Chemistry      Component Value Date/Time   NA 138 02/12/2024 2017   K 4.6 02/12/2024 2017   CL 105 02/12/2024 2017   CO2 21 (L) 02/12/2024 2017   BUN 22 02/12/2024 2017   CREATININE 0.76 02/12/2024 2017      Component Value Date/Time   CALCIUM 8.8 (L) 02/12/2024 2017   ALKPHOS 59 02/12/2024 2017   AST 38 02/12/2024 2017   ALT 13 02/12/2024 2017    BILITOT 0.6 02/12/2024 2017       RADIOGRAPHIC STUDIES: I have personally reviewed the radiological images as listed and agreed with the findings in the report. DG C-Arm 1-60 Min Result Date: 02/28/2024 EXAM: FLUOROSCOPIC IMAGES, 1 view TECHNIQUE: Fluoroscopy was provided by the radiology department for procedure. Radiologist was not present during examination. FLUOROSCOPY DOSE AND TYPE: Radiation Dose Index: Reference Air Kerma (in mGy) = 3.23 COMPARISON: None available. CLINICAL HISTORY: Surgery: INSERTION, TUNNELED CENTRAL VENOUS DEVICE, WITH PORT. FINDINGS: Intraoperative fluoroscopic imaging was performed and was normal. IMPRESSION: 1. Right-sided power-injectable port-a-cath in place with catheter tip likely in the upper SVC. NOTE: Intraoperative fluoroscopic spot images as above. Please refer to the intraoperative report for full details. Electronically signed by: Ryan Salvage MD 02/28/2024 02:12 PM EDT RP Workstation: [id number]   CT CHEST ABDOMEN PELVIS W CONTRAST Result Date: 02/27/2024 CLINICAL DATA:  New diagnosis of inflammatory left breast cancer.  Remote history of right mastectomy for right breast cancer. * Tracking Code: BO * EXAM: CT CHEST, ABDOMEN, AND PELVIS WITH CONTRAST TECHNIQUE: Multidetector CT imaging of the chest, abdomen and pelvis was performed following the standard protocol during bolus administration of intravenous contrast. RADIATION DOSE REDUCTION: This exam was performed according to the departmental dose-optimization program which includes automated exposure control, adjustment of the mA and/or kV according to patient size and/or use of iterative reconstruction technique. CONTRAST:  100mL OMNIPAQUE IOHEXOL 300 MG/ML  SOLN COMPARISON:  Whole-body bone scan, same date. FINDINGS: CT CHEST FINDINGS Cardiovascular: The cardiac silhouette, mediastinal and hilar contours are within normal limits. The aorta is normal in caliber. Scattered atherosclerotic calcifications but  dissection. The pulmonary arteries are grossly normal. Mediastinum/Nodes: Superior mediastinal and prevascular lymphadenopathy. Index prevascular node on image 23/2 measures 14 mm. There is also left internal mammary adenopathy the with index node measuring 11 mm on image 25/2. No hilar or subcarinal adenopathy. The esophagus is grossly normal. Lungs/Pleura: No acute pulmonary process. No pleural effusions or pleural lesions. No pulmonary lesions or worrisome pulmonary nodules to suggest pulmonary metastatic disease. Musculoskeletal: Diffuse and marked interstitial thickening involving the entire left breast along with marked skin thickening and extensive left axillary lymphadenopathy. Index node on image 16/2 measures 25 mm. Second node slightly more inferiorly on image 23/2 measures 25 mm. Surgical changes from a right mastectomy. No right-sided chest wall mass or axillary adenopathy the. No definite lytic or sclerotic metastatic bone lesions. There is a small lucency noted in the T3 vertebral body but this measures water attenuation and has a smooth well corticated margin and is likely a benign cyst. No definite abnormality on the bone scan. Abnormality involving the right anterior seventh rib is difficult to definitively characterize but could be a healing rib fracture. Focus of metastatic disease with some healing changes is also possible. No other rib abnormalities. CT ABDOMEN PELVIS FINDINGS Hepatobiliary: No hepatic lesions to suggest metastatic disease. The gallbladder is unremarkable. No common bile duct dilatation. Pancreas: No mass, inflammation or ductal dilatation. Spleen: Normal in size without focal abnormality. Adrenals/Urinary Tract: The adrenal glands are normal. No significant renal abnormalities. Right interpolar renal cyst complicated by a thin minimally calcified septation (Bosniak 2) no further imaging evaluation follow-up is necessary. The bladder is unremarkable. Stomach/Bowel: The stomach,  duodenum, small and colon are grossly normal. No acute inflammatory process, mass lesions or obstructive findings. Significant descending and sigmoid colon diverticulosis. Vascular/Lymphatic: Scattered atherosclerotic calcifications involving the aorta and branch vessels but no aneurysm or dissection. Small scattered mesenteric and retroperitoneal lymph nodes but no mass or overt adenopathy. The major venous structures are patent. Reproductive: Enlarged fibroid uterus some of which are calcified. The endometrium is thickened and irregular in appearance measuring up to 14 mm. Recommend correlation with pelvic ultrasound and any history of postmenopausal bleeding. No adnexal mass. Other: No free fluid or free air. No subcutaneous lesions. Small. Wall hernia containing a small loop of bowel but no evidence of incarceration or obstruction. Musculoskeletal: No definite lytic or sclerotic bone lesions to suggest metastatic bone disease. Advanced degenerative changes involving the spine and left hip. IMPRESSION: 1. Diffuse and marked interstitial thickening involving the entire left breast along with marked skin thickening and extensive left axillary lymphadenopathy. Findings consistent with known inflammatory breast cancer. 2. Superior mediastinal, prevascular and internal mammary lymphadenopathy. 3. No findings for pulmonary metastatic disease. 4. No findings for abdominal/pelvic metastatic disease. 5. No definite lytic or sclerotic bone lesions to  suggest metastatic bone disease. 6. Abnormality involving the right anterior seventh rib is difficult to definitively characterize but could be a healing rib fracture. Focus of metastatic disease with some healing changes is also possible. 7. Enlarged fibroid uterus. The endometrium is thickened and irregular in appearance measuring up to 14 mm. Recommend correlation with pelvic ultrasound and any history of postmenopausal bleeding. 8. Aortic atherosclerosis. Aortic  Atherosclerosis (ICD10-I70.0). Electronically Signed   By: MYRTIS Stammer M.D.   On: 02/27/2024 20:09   NM Bone Scan Whole Body Result Date: 02/27/2024 CLINICAL DATA:  Staging breast cancer. EXAM: NUCLEAR MEDICINE WHOLE BODY BONE SCAN TECHNIQUE: Whole body anterior and posterior images were obtained approximately 3 hours after intravenous injection of radiopharmaceutical. RADIOPHARMACEUTICALS:  19.00 mCi Technetium-14m MDP IV COMPARISON:  CT scan, same date. FINDINGS: Diffuse and marked soft tissue uptake in the left breast consistent with patient's known inflammatory breast cancer. Uptake noted in the region of the left sternoclavicular joint which appears to correspond to severe degenerative changes on the CT scan. There also areas of degenerative type uptake noted throughout the spine, and both shoulders, both knees and both ankles/feet. Significant asymmetric uptake involving the left hip also appears to be due to advanced degenerative disease. Focal uptake noted in the right seventh anterior rib which appears to be a healing fracture. Diffuse uptake throughout the spine is likely degenerative also. No obvious lytic or sclerotic lesions. IMPRESSION: 1. Diffuse soft tissue uptake in the left breast consistent with patient's known inflammatory breast cancer. 2. Areas of degenerative type uptake as detailed above. No definite findings for metastatic bone disease. 3. Focal area of uptake in the right anterior seventh rib likely healing fracture based on CT scan. Electronically Signed   By: MYRTIS Stammer M.D.   On: 02/27/2024 19:55   MR BREAST BILATERAL W WO CONTRAST INC CAD Result Date: 02/26/2024 CLINICAL DATA:  68 year old female who presented with diffuse redness and swelling of the left breast which did not improve with antibiotic therapy. Patient underwent ultrasound guided core biopsy of the 6 o'clock region of the left breast on 02/15/2024 demonstrating triple negative invasive mammary carcinoma (ribbon  shaped clip) and metastatic left axillary adenopathy (spiral HydroMARK clip). Suspected inflammatory subtype due to diffuse redness and swelling. History of right breast cancer status post mastectomy 43 years ago. EXAM: BILATERAL BREAST MRI WITH AND WITHOUT CONTRAST TECHNIQUE: Multiplanar, multisequence MR images of both breasts were obtained prior to and following the intravenous administration of 10 ml of Vueway Three-dimensional MR images were rendered by post-processing of the original MR data on an independent workstation. The three-dimensional MR images were interpreted, and findings are reported in the following complete MRI report for this study. Three dimensional images were evaluated at the independent interpreting workstation using the DynaCAD thin client. COMPARISON:  Mammogram and ultrasound dated 02/15/2024. No prior MRI is available for comparison. FINDINGS: Breast composition: c. Heterogeneous fibroglandular tissue. Background parenchymal enhancement: Marked Right breast: Patient is status post right mastectomy. No mass or abnormal enhancement identified in the mastectomy site. Left breast: There is diffuse non masslike enhancement throughout the entire left breast with trabecular thickening, as well as skin thickening. There is enhancement of the skin as well as the nipple. Findings are consistent with inflammatory breast cancer. Lymph nodes: There are numerous enlarged level 1 and level 2 left axillary lymph nodes. There is a level 1 nodal mass measuring up to 6.5 cm (image 73 series 12). There is a level 2 enlarged lymph node  measuring 1.9 cm (image 75 series 12). There is an abnormal left internal mammary lymph node measuring 1.7 cm (image 122 series 10). Ancillary findings:  None. IMPRESSION: 1. Imaging findings are consistent with left breast inflammatory breast cancer with diffuse enhancement throughout the breast, skin and nipple. 2.  Enlarged level 1 and level 2 left axillary adenopathy. 3.   Enlarged left internal mammary lymph node. RECOMMENDATION: Treatment plan recommended. BI-RADS CATEGORY  6: Known biopsy-proven malignancy. Electronically Signed   By: Dina  Arceo M.D.   On: 02/26/2024 05:09   ECHOCARDIOGRAM COMPLETE Result Date: 02/23/2024    ECHOCARDIOGRAM REPORT   Patient Name:   KADE RICKELS    Date of Exam: 02/23/2024 Medical Rec #:  [id number]       Height:       69.0 in Accession #:    [id number]      Weight:       242.8 lb Date of Birth:  01-Nov-1955      BSA:          2.243 m Patient Age:    67 years        BP:           136/65 mmHg Patient Gender: F               HR:           71 bpm. Exam Location:  [address] Procedure: 2D Echo (Both Spectral and Color Flow Doppler were utilized during            procedure). Indications:    Malignant neoplasm of lower-outer quadrant of left breast of                 female, estrogen receptor negative (HCC) [C50.512, Z17.1                 (ICD-10-CM)]  History:        Patient has no prior history of Echocardiogram examinations.                 Risk Factors:Non-Smoker.  Sonographer:    Gordon Miyamoto Referring Phys: [id number] Terance Pomplun  Sonographer Comments: Technically difficult study due to poor echo windows. Cancer of the left breast. IMPRESSIONS  1. Left ventricular ejection fraction, by estimation, is 55 to 60%. The left ventricle has normal function. Left ventricular endocardial border not optimally defined to evaluate regional wall motion. Left ventricular diastolic parameters were normal.  2. Right ventricular systolic function is normal. The right ventricular size is normal.  3. The mitral valve was not well visualized. No evidence of mitral valve regurgitation. Moderate mitral annular calcification.  4. The aortic valve was not well visualized. Aortic valve regurgitation is not visualized.  5. The inferior vena cava is normal in size with greater than 50% respiratory variability, suggesting right atrial pressure of 3 mmHg. Comparison(s): No  prior Echocardiogram. Conclusion(s)/Recommendation(s): Technically difficult with poor echo windows. Consider repeat echo with Definity for better endocardial visualization and functional assessment. FINDINGS  Left Ventricle: Left ventricular ejection fraction, by estimation, is 55 to 60%. The left ventricle has normal function. Left ventricular endocardial border not optimally defined to evaluate regional wall motion. Strain was performed and the global longitudinal strain is indeterminate. The left ventricular internal cavity size was normal in size. Suboptimal image quality limits for assessment of left ventricular hypertrophy. Left ventricular diastolic parameters were normal. Right Ventricle: The right ventricular size is normal. No increase in right ventricular wall thickness. Right  ventricular systolic function is normal. Left Atrium: Left atrial size was normal in size. Right Atrium: Right atrial size was normal in size. Pericardium: The pericardium was not well visualized. Mitral Valve: The mitral valve was not well visualized. There is moderate thickening of the mitral valve leaflet(s). Moderate mitral annular calcification. No evidence of mitral valve regurgitation. Tricuspid Valve: The tricuspid valve is not well visualized. Tricuspid valve regurgitation is not demonstrated. Aortic Valve: The aortic valve was not well visualized. Aortic valve regurgitation is not visualized. Aortic valve mean gradient measures 4.0 mmHg. Aortic valve peak gradient measures 7.0 mmHg. Pulmonic Valve: The pulmonic valve was not well visualized. Pulmonic valve regurgitation is not visualized. Aorta: The aortic root and ascending aorta are structurally normal, with no evidence of dilitation. Venous: The inferior vena cava is normal in size with greater than 50% respiratory variability, suggesting right atrial pressure of 3 mmHg. IAS/Shunts: No atrial level shunt detected by color flow Doppler.  LEFT VENTRICLE PLAX 2D LVIDd:          4.61 cm Diastology LVIDs:         2.90 cm LV e' medial:    8.38 cm/s LV PW:         1.17 cm LV E/e' medial:  9.1 LV IVS:        1.10 cm LV e' lateral:   12.30 cm/s                        LV E/e' lateral: 6.2  RIGHT VENTRICLE             IVC RV Basal diam:  2.10 cm     IVC diam: 1.01 cm RV Mid diam:    2.05 cm RV S prime:     11.40 cm/s TAPSE (M-mode): 1.9 cm LEFT ATRIUM             Index        RIGHT ATRIUM           Index LA diam:        4.57 cm 2.04 cm/m   RA Area:     14.60 cm LA Vol (A2C):   44.1 ml 19.66 ml/m  RA Volume:   31.80 ml  14.18 ml/m LA Vol (A4C):   53.4 ml 23.81 ml/m LA Biplane Vol: 51.7 ml 23.05 ml/m  AORTIC VALVE AV Vmax:           132.00 cm/s AV Vmean:          88.200 cm/s AV VTI:            0.274 m AV Peak Grad:      7.0 mmHg AV Mean Grad:      4.0 mmHg LVOT Vmax:         92.50 cm/s LVOT Vmean:        63.200 cm/s LVOT VTI:          0.208 m LVOT/AV VTI ratio: 0.76  AORTA Ao Sinus diam: 2.62 cm Ao Asc diam:   2.85 cm MITRAL VALVE MV Area (PHT): 3.27 cm    SHUNTS MV Decel Time: 232 msec    Systemic VTI: 0.21 m MV E velocity: 76.00 cm/s MV A velocity: 84.40 cm/s MV E/A ratio:  0.90 Emeline Calender Electronically signed by Emeline Calender Signature Date/Time: 02/23/2024/5:25:26 PM    Final    US  AXILLARY NODE CORE BIOPSY LEFT Addendum Date: 02/16/2024 ADDENDUM REPORT: 02/16/2024 13:29 ADDENDUM: PATHOLOGY revealed: Site 1.  Breast, left, needle core biopsy, 6 o'clock 3.7 cm masslike area INVASIVE MAMMARY CARCINOMA, OVERALL GRADE: 3; LYMPHOVASCULAR INVASION: NOT IDENTIFIED CANCER LENGTH: 0.9 CM CALCIFICATIONS: NOT IDENTIFIED OTHER FINDINGS: NONE Pathology results are CONCORDANT with imaging findings, per Dr. Reyes Phi. PATHOLOGY revealed: Site 2. Lymph node, needle/core biopsy, left axillary node DUCTAL CARCINOMA CONSISTENT WITH METASTASIS NO RESIDUAL LYMPH NODE TISSUE PRESENT. Pathology results are CONCORDANT with imaging findings, per Dr. Reyes Phi. Pathology results and recommendations below  were discussed with patient by telephone on 02/16/24 by Rock Hover RN. Patient reported biopsy site within normal limits with slight tenderness at the site. Post biopsy care instructions were reviewed, questions were answered and my direct phone number was provided to patient. Patient was instructed to call [facility] if any concerns or questions arise related to the biopsy. RECOMMENDATIONS: 1. Surgical and oncological consultation. Request for surgical consultation with Dr. Donnice Bury, per patient request, relayed to Landry Finger at [facility] on 02/16/2024 by Rock Hover RN. 2. Consider pretreatment bilateral breast MRI with and without contrast, and possible skin punch biopsy, if breast conservation is pursued. Pathology results reported by Rock Hover RN on 02/16/2024. Electronically Signed   By: Reyes Phi M.D.   On: 02/16/2024 13:29   Result Date: 02/16/2024 CLINICAL DATA:  68 year old female presents for ultrasound-guided biopsies of a 3.7 cm heterogeneous masslike area within the LOWER LEFT breast and an abnormal LEFT axillary lymph node. EXAM: ULTRASOUND GUIDED LEFT BREAST CORE NEEDLE BIOPSY US  AXILLARY NODE CORE BIOPSY LEFT COMPARISON:  Previous exam(s). PROCEDURE: I met with the patient and we discussed the procedure of ultrasound-guided biopsy, including benefits and alternatives. We discussed the high likelihood of a successful procedure. We discussed the risks of the procedure, including infection, bleeding, tissue injury, clip migration, and inadequate sampling. Informed written consent was given. The usual time-out protocol was performed immediately prior to the procedure. ULTRASOUND GUIDED LEFT BREAST CORE NEEDLE BIOPSY Using sterile technique and 1% Lidocaine  with and without epinephrine  as local anesthetic, under direct ultrasound visualization, a 12 gauge spring-loaded device was used to perform biopsy of the 3.7 cm heterogeneous masslike area at the 6  o'clock position using a MEDIAL approach. At the conclusion of the procedure a RIBBON shaped tissue marker clip was deployed into the biopsy cavity. Follow up 2 view mammogram was performed and dictated separately. US  AXILLARY NODE CORE BIOPSY LEFT Using sterile technique and 1% Lidocaine  with and without epinephrine  as local anesthetic, under direct ultrasound visualization, a 14 gauge spring-loaded device was used to perform biopsy of 1 of the abnormal LEFT axillary lymph nodes using a MEDIAL approach. At the conclusion of the procedure HydroMARK spiral tissue marker clip was deployed into the biopsy cavity. Follow up 2 view mammogram was performed and dictated separately. IMPRESSION: Ultrasound guided biopsy of 3.7 cm anterior LOWER LEFT breast heterogeneous masslike area. Ultrasound-guided biopsy of abnormal LEFT axillary lymph node. No apparent complications. Electronically Signed: By: Reyes Phi M.D. On: 02/15/2024 09:48   US  LT BREAST BX W LOC DEV 1ST LESION IMG BX SPEC US  GUIDE Addendum Date: 02/16/2024 ADDENDUM REPORT: 02/16/2024 13:29 ADDENDUM: PATHOLOGY revealed: Site 1. Breast, left, needle core biopsy, 6 o'clock 3.7 cm masslike area INVASIVE MAMMARY CARCINOMA, OVERALL GRADE: 3; LYMPHOVASCULAR INVASION: NOT IDENTIFIED CANCER LENGTH: 0.9 CM CALCIFICATIONS: NOT IDENTIFIED OTHER FINDINGS: NONE Pathology results are CONCORDANT with imaging findings, per Dr. Reyes Phi. PATHOLOGY revealed: Site 2. Lymph node, needle/core biopsy, left axillary node DUCTAL CARCINOMA CONSISTENT  WITH METASTASIS NO RESIDUAL LYMPH NODE TISSUE PRESENT. Pathology results are CONCORDANT with imaging findings, per Dr. Reyes Phi. Pathology results and recommendations below were discussed with patient by telephone on 02/16/24 by Rock Hover RN. Patient reported biopsy site within normal limits with slight tenderness at the site. Post biopsy care instructions were reviewed, questions were answered and my direct phone number was  provided to patient. Patient was instructed to call [facility] if any concerns or questions arise related to the biopsy. RECOMMENDATIONS: 1. Surgical and oncological consultation. Request for surgical consultation with Dr. Donnice Bury, per patient request, relayed to Landry Finger at [facility] on 02/16/2024 by Rock Hover RN. 2. Consider pretreatment bilateral breast MRI with and without contrast, and possible skin punch biopsy, if breast conservation is pursued. Pathology results reported by Rock Hover RN on 02/16/2024. Electronically Signed   By: Reyes Phi M.D.   On: 02/16/2024 13:29   Result Date: 02/16/2024 CLINICAL DATA:  68 year old female presents for ultrasound-guided biopsies of a 3.7 cm heterogeneous masslike area within the LOWER LEFT breast and an abnormal LEFT axillary lymph node. EXAM: ULTRASOUND GUIDED LEFT BREAST CORE NEEDLE BIOPSY US  AXILLARY NODE CORE BIOPSY LEFT COMPARISON:  Previous exam(s). PROCEDURE: I met with the patient and we discussed the procedure of ultrasound-guided biopsy, including benefits and alternatives. We discussed the high likelihood of a successful procedure. We discussed the risks of the procedure, including infection, bleeding, tissue injury, clip migration, and inadequate sampling. Informed written consent was given. The usual time-out protocol was performed immediately prior to the procedure. ULTRASOUND GUIDED LEFT BREAST CORE NEEDLE BIOPSY Using sterile technique and 1% Lidocaine  with and without epinephrine  as local anesthetic, under direct ultrasound visualization, a 12 gauge spring-loaded device was used to perform biopsy of the 3.7 cm heterogeneous masslike area at the 6 o'clock position using a MEDIAL approach. At the conclusion of the procedure a RIBBON shaped tissue marker clip was deployed into the biopsy cavity. Follow up 2 view mammogram was performed and dictated separately. US  AXILLARY NODE CORE BIOPSY LEFT Using  sterile technique and 1% Lidocaine  with and without epinephrine  as local anesthetic, under direct ultrasound visualization, a 14 gauge spring-loaded device was used to perform biopsy of 1 of the abnormal LEFT axillary lymph nodes using a MEDIAL approach. At the conclusion of the procedure HydroMARK spiral tissue marker clip was deployed into the biopsy cavity. Follow up 2 view mammogram was performed and dictated separately. IMPRESSION: Ultrasound guided biopsy of 3.7 cm anterior LOWER LEFT breast heterogeneous masslike area. Ultrasound-guided biopsy of abnormal LEFT axillary lymph node. No apparent complications. Electronically Signed: By: Reyes Phi M.D. On: 02/15/2024 09:48   MM CLIP PLACEMENT LEFT Result Date: 02/15/2024 CLINICAL DATA:  Evaluate placement of biopsy clips following ultrasound-guided LEFT breast/axillary biopsies. EXAM: 3D DIAGNOSTIC LEFT MAMMOGRAM POST ULTRASOUND BIOPSY COMPARISON:  Previous exam(s). ACR Breast Density Category c: The breast is heterogeneously dense, which may obscure small masses. FINDINGS: 3D Mammographic images were obtained following ultrasound guided biopsy of the 3.6 cm heterogeneous masslike area at the 6 o'clock position and 1 of the abnormal LEFT axillary lymph nodes. The RIBBON biopsy marking clip is in expected position at the site of biopsy in the anterior LOWER LEFT breast. The HydroMARK spiral biopsy clip is in expected position within the biopsied LEFT axillary lymph node. IMPRESSION: Appropriate positioning of the RIBBON shaped biopsy marking clip at the site of biopsy in the anterior LOWER LEFT breast. Appropriate positioning of  the HydroMARK spiral shaped biopsy marking clip in the biopsied LEFT axillary lymph node. Final Assessment: Post Procedure Mammograms for Marker Placement Electronically Signed   By: Reyes Phi M.D.   On: 02/15/2024 09:44   MM 3D DIAGNOSTIC MAMMOGRAM UNILATERAL LEFT BREAST Result Date: 02/15/2024 CLINICAL DATA:  68 year old female  presents with diffuse LEFT breast pain redness and thickening, recently placed on antibiotics with no change. History of RIGHT mastectomy 35-40 years ago for uncertain reason. EXAM: DIGITAL DIAGNOSTIC UNILATERAL LEFT MAMMOGRAM WITH TOMOSYNTHESIS AND CAD; ULTRASOUND LEFT BREAST LIMITED TECHNIQUE: Left digital diagnostic mammography and breast tomosynthesis was performed. The images were evaluated with computer-aided detection. ; Targeted ultrasound examination of the left breast was performed. COMPARISON:  Previous exam(s). ACR Breast Density Category c: The breasts are heterogeneously dense, which may obscure small masses. FINDINGS: A focal asymmetry within the RETROAREOLAR LEFT breast is noted. A larger area of focal asymmetry within the middle to posterior depth OUTER LEFT breast identified. There is diffuse LEFT breast skin thickening and prominent LEFT axillary lymph nodes. No suspicious calcifications are noted. On physical exam, dimpled thickened discolored skin is noted throughout much of the LEFT breast with peau d'orange appearance. Diffuse firmness throughout much of the anterior, central and LOWER LEFT breast is identified. A LEFT nipple wound is noted. Targeted ultrasound is performed, showing diffuse heterogeneity within the RETROAREOLAR LEFT breast with a more focal discrete masslike area measuring 2.9 x 1.7 x 3.7 cm at the 6 o'clock position 3 cm from the nipple. Diffuse skin thickening is noted. Three abnormal enlarged LEFT axillary lymph nodes loss of hila noted. IMPRESSION: 1. Findings highly suspicious for LEFT breast cancer (inflammatory), with skin thickening and 3 abnormal LEFT axillary lymph nodes. Tissue sampling of the more focal heterogeneous masslike area in the LOWER LEFT breast and 1 of the abnormal lymph nodes is recommended. RECOMMENDATION: Ultrasound-guided biopsy of LOWER LEFT breast mass and an abnormal LEFT axillary lymph node, which will be performed today but dictated in a  separate report. I have discussed the findings and recommendations with the patient. If applicable, a reminder letter will be sent to the patient regarding the next appointment. BI-RADS CATEGORY  5: Highly suggestive of malignancy. Electronically Signed   By: Reyes Phi M.D.   On: 02/15/2024 09:41   US  LIMITED ULTRASOUND INCLUDING AXILLA LEFT BREAST  Result Date: 02/15/2024 CLINICAL DATA:  68 year old female presents with diffuse LEFT breast pain redness and thickening, recently placed on antibiotics with no change. History of RIGHT mastectomy 35-40 years ago for uncertain reason. EXAM: DIGITAL DIAGNOSTIC UNILATERAL LEFT MAMMOGRAM WITH TOMOSYNTHESIS AND CAD; ULTRASOUND LEFT BREAST LIMITED TECHNIQUE: Left digital diagnostic mammography and breast tomosynthesis was performed. The images were evaluated with computer-aided detection. ; Targeted ultrasound examination of the left breast was performed. COMPARISON:  Previous exam(s). ACR Breast Density Category c: The breasts are heterogeneously dense, which may obscure small masses. FINDINGS: A focal asymmetry within the RETROAREOLAR LEFT breast is noted. A larger area of focal asymmetry within the middle to posterior depth OUTER LEFT breast identified. There is diffuse LEFT breast skin thickening and prominent LEFT axillary lymph nodes. No suspicious calcifications are noted. On physical exam, dimpled thickened discolored skin is noted throughout much of the LEFT breast with peau d'orange appearance. Diffuse firmness throughout much of the anterior, central and LOWER LEFT breast is identified. A LEFT nipple wound is noted. Targeted ultrasound is performed, showing diffuse heterogeneity within the RETROAREOLAR LEFT breast with a more focal discrete masslike area  measuring 2.9 x 1.7 x 3.7 cm at the 6 o'clock position 3 cm from the nipple. Diffuse skin thickening is noted. Three abnormal enlarged LEFT axillary lymph nodes loss of hila noted. IMPRESSION: 1. Findings  highly suspicious for LEFT breast cancer (inflammatory), with skin thickening and 3 abnormal LEFT axillary lymph nodes. Tissue sampling of the more focal heterogeneous masslike area in the LOWER LEFT breast and 1 of the abnormal lymph nodes is recommended. RECOMMENDATION: Ultrasound-guided biopsy of LOWER LEFT breast mass and an abnormal LEFT axillary lymph node, which will be performed today but dictated in a separate report. I have discussed the findings and recommendations with the patient. If applicable, a reminder letter will be sent to the patient regarding the next appointment. BI-RADS CATEGORY  5: Highly suggestive of malignancy. Electronically Signed   By: Reyes Phi M.D.   On: 02/15/2024 09:41    All questions were answered. The patient knows to call the clinic with any problems, questions or concerns. I spent 30 minutes in the care of this patient including H and P, review of records, counseling and coordination of care.     Amber Stalls, MD 03/06/2024 11:46 AM

## 2024-03-06 NOTE — Progress Notes (Signed)
 [facility]       Telephone: [phone]?Fax: [phone]   Oncology Clinical Pharmacist Practitioner Initial Assessment  Whitney Villegas is a 68 y.o. female with a diagnosis of breast cancer. They were contacted today via in-person visit.  Indication/Regimen Pembrolizumab (Keytruda), docetaxel (Taxotere), carboplatin (Paraplatin)  is being used appropriately for treatment of breast cancer by Dr. Amber Stalls.      Wt Readings from Last 1 Encounters:  03/06/24 239 lb 6.4 oz (108.6 kg)    Estimated body surface area is 2.3 meters squared as calculated from the following:   Height as of 02/28/24: 5' 9 (1.753 m).   Weight as of an earlier encounter on 03/06/24: 239 lb 6.4 oz (108.6 kg).  The dosing regimen is every 21 days for 6 cycles  Pembrolizumab (200 mg) on Day 1 Docetaxel (75 mg/m2) on Day 1 Carboplatin (AUC 6) on Day 1 Pegfilgrastim (6 mg) on Day 3  Dose Modifications Dr. Stalls is reducing carboplatin to AUC 5   Allergies No Known Allergies  Vitals    03/06/2024   11:44 AM 02/28/2024   10:06 AM 02/28/2024   10:00 AM  Oncology Vitals  Weight 108.591 kg    Weight (lbs) 239 lbs 6 oz    BMI 35.35 kg/m2    Temp 97.6 F (36.4 C) 97.5 F (36.4 C)   Pulse Rate 74 63 62  BP 142/72 138/71 143/70  Resp 16 16 13   SpO2 95 % 97 % 97 %  BSA (m2) 2.3 m2       Laboratory Data    Latest Ref Rng & Units 02/12/2024    8:17 PM  CBC EXTENDED  WBC 4.0 - 10.5 K/uL 7.9   RBC 3.87 - 5.11 MIL/uL 5.03   Hemoglobin 12.0 - 15.0 g/dL 88.6   HCT 63.9 - 53.9 % 37.2   Platelets 150 - 400 K/uL 184   NEUT# 1.7 - 7.7 K/uL 5.0   Lymph# 0.7 - 4.0 K/uL 2.0        Latest Ref Rng & Units 02/12/2024    8:17 PM  CMP  Glucose 70 - 99 mg/dL 97   BUN 8 - 23 mg/dL 22   Creatinine 9.55 - 1.00 mg/dL 9.23   Sodium 864 - 854 mmol/L 138   Potassium 3.5 - 5.1 mmol/L 4.6   Chloride 98 - 111 mmol/L 105   CO2 22 - 32 mmol/L 21   Calcium 8.9 - 10.3 mg/dL 8.8   Total Protein  6.5 - 8.1 g/dL 7.4   Total Bilirubin 0.0 - 1.2 mg/dL 0.6   Alkaline Phos 38 - 126 U/L 59   AST 15 - 41 U/L 38   ALT 0 - 44 U/L 13    Contraindications Contraindications were reviewed? Yes Contraindications to therapy were identified? No   Safety Precautions The following safety precautions for the use of pembrolizumab, docetaxel, carboplatin were reviewed:  Fever: reviewed the importance of having a thermometer and the Centers for Disease Control and Prevention (CDC) definition of fever which is 100.28F (38C) or higher. Patient should call 24/7 triage at [phone] if experiencing a fever or any other symptoms Decreased white blood cells (WBCs) and increased risk for infection Decreased hemoglobin, part of the red blood cells that carry iron and oxygen Decreased platelet count and increased risk for bleeding Changes in electrolytes and other laboratory values Changes in kidney function Changes in liver function Cough, dyspnea Diarrhea (loose and/or urgent bowel movements) Fatigue  Hair loss Musculoskeletal pain Nail Changes Nausea or vomiting Pneumonitis Rash or itchy skin Stomatitis Numbness or tingling in hands and feet Handling body fluids and waste Intimacy, sexual activity, contraception, fertility, breastfeeding  Medication Reconciliation Current Outpatient Medications  Medication Sig Dispense Refill   cholecalciferol (VITAMIN D3) 25 MCG (1000 UNIT) tablet Take 1,000 Units by mouth daily.     Multiple Vitamins-Minerals (MULTIVITAMIN WITH MINERALS) tablet Take 1 tablet by mouth daily. Centrum silver     No current facility-administered medications for this visit.   Medication reconciliation is based on the patient's most recent medication list in the electronic medical record (EMR) including herbal products and OTC medications.   The patient's medication list was reviewed today with the patient? Yes   Drug-drug interactions (DDIs) DDIs were evaluated?  Yes Significant DDIs identified? No   Drug-Food Interactions Drug-food interactions were evaluated? Yes Drug-food interactions identified? Avoid grapefruit products  Follow-up Plan  Treatment start date: TBD, planned for 03/12/24 Port placement date: 02/28/24 We reviewed the prescriptions, premedications, and treatment regimen with the patient. Possible side effects of the treatment regimen were reviewed and management strategies were discussed.  Can use over-the-counter (OTC) options of loperamide (Imodium) as needed for diarrhea, loratadine (Claritin) as needed for G-CSF bone pain, and docusate + senna (Senna-S) as needed for constipation.  Clinical pharmacy will assist Dr. Praveena Iruku and Aivah Barretta on an as needed basis going forward  [name] participated in the discussion, expressed understanding, and voiced agreement with the above plan. [name] The patient was advised to contact the clinic at [phone] with any questions or concerns prior to her return visit.   I spent 60 minutes assessing the patient.  Cassidie Veiga A. Lucila, PharmD, BCOP, CPP  Norleen DELENA Lucila, RPH-CPP, 03/06/2024 12:02 PM  **Disclaimer: This note was dictated with voice recognition software. Similar sounding words can inadvertently be transcribed and this note may contain transcription errors which may not have been corrected upon publication of note.**

## 2024-03-06 NOTE — Progress Notes (Deleted)
 [facility]       Telephone: [phone]?Fax: [phone]   Oncology Clinical Pharmacist Practitioner Initial Assessment  Whitney Villegas is a 68 y.o. female with a diagnosis of breast cancer. They were contacted today via in-person visit.  Indication/Regimen Pembrolizumab (Keytruda), paclitaxel (Taxol), carboplatin (Paraplatin ) followed by pembrolizumab [name], doxorubicin (Adriamycin), cyclophosphamide (Cytoxan) is being used appropriately for treatment of breast cancer by Dr. Amber Stalls.      Wt Readings from Last 1 Encounters:  02/28/24 242 lb (109.8 kg)    Estimated body surface area is 2.31 meters squared as calculated from the following:   Height as of 02/28/24: 5' 9 (1.753 m).   Weight as of 02/28/24: 242 lb (109.8 kg).  The dosing regimen cycle is every 21 days x 4 cycles  Pembrolizumab (200 mg) on Day 1 Paclitaxel (80 mg/m2) on Days 1, 8, 15 Carboplatin (AUC 1.5) on Day 1, 8, 15 Filgrastim (300 mcg or 480 mcg based on weight)  on Days 16, 17, 18  Followed by the below regimen cycle every 21 days x 4 cycles  Pembrolizumab (200 mg) Day 1 Doxorubicin (60 mg/m2) Day 1 Cyclophosphamide (600 mg/m2) Day 1 Pegfilgrastim (6 mg) on Day 3  It is planned to continue until treatment plan completion or unacceptable toxicity. The tentative start date is: 03/12/24  Dose Modifications None   Allergies No Known Allergies  Vitals    02/28/2024   10:06 AM 02/28/2024   10:00 AM 02/28/2024    9:45 AM  Oncology Vitals  Temp 97.5 F (36.4 C)    Pulse Rate 63 62 71  BP 138/71 143/70 130/71  Resp 16 13 18   SpO2 97 % 97 % 92 %     Laboratory Data    Latest Ref Rng & Units 02/12/2024    8:17 PM  CBC EXTENDED  WBC 4.0 - 10.5 K/uL 7.9   RBC 3.87 - 5.11 MIL/uL 5.03   Hemoglobin 12.0 - 15.0 g/dL 88.6   HCT 63.9 - 53.9 % 37.2   Platelets 150 - 400 K/uL 184   NEUT# 1.7 - 7.7 K/uL 5.0   Lymph# 0.7 - 4.0 K/uL 2.0        Latest Ref Rng & Units  02/12/2024    8:17 PM  CMP  Glucose 70 - 99 mg/dL 97   BUN 8 - 23 mg/dL 22   Creatinine 9.55 - 1.00 mg/dL 9.23   Sodium 864 - 854 mmol/L 138   Potassium 3.5 - 5.1 mmol/L 4.6   Chloride 98 - 111 mmol/L 105   CO2 22 - 32 mmol/L 21   Calcium 8.9 - 10.3 mg/dL 8.8   Total Protein 6.5 - 8.1 g/dL 7.4   Total Bilirubin 0.0 - 1.2 mg/dL 0.6   Alkaline Phos 38 - 126 U/L 59   AST 15 - 41 U/L 38   ALT 0 - 44 U/L 13    Contraindications Contraindications were reviewed? Yes Contraindications to therapy were identified? No   Safety Precautions The following safety precautions were reviewed:  Fever: reviewed the importance of having a thermometer and the Centers for Disease Control and Prevention (CDC) definition of fever which is 100.73F (38C) or higher. Patient should call 24/7 triage at [phone] if experiencing a fever or any other symptoms Decreased white blood cells (WBCs) and increased risk for infection Decreased platelet count and increased risk of bleeding Decreased hemoglobin, part of the red blood cells that carry iron and oxygen  Nausea or vomiting Diarrhea or constipation Hair Loss Fatigue Changes in liver function Rash Peripheral Neuropathy Hypersensitivity reactions Pembrolizumab toxicities (skin, lung, liver, thyroid, kidneys, vision) Changes in color of urine Mouth sores MDS/AML Grapefruit products may interact with paclitaxel. Avoid use Handling body fluids and waste Intimacy, sexual activity, contraception, fertility ***  Medication Reconciliation Current Outpatient Medications  Medication Sig Dispense Refill   cholecalciferol (VITAMIN D3) 25 MCG (1000 UNIT) tablet Take 1,000 Units by mouth daily.     Multiple Vitamins-Minerals (MULTIVITAMIN WITH MINERALS) tablet Take 1 tablet by mouth daily. Centrum silver     No current facility-administered medications for this visit.    Medication reconciliation is based on the patient's most recent medication list in  the electronic medical record (EMR) including herbal products and OTC medications.   The patient's medication list was reviewed today with the patient? Yes   Drug-drug interactions (DDIs) DDIs were evaluated? Yes Significant DDIs identified? No   Drug-Food Interactions Drug-food interactions were evaluated? Yes Drug-food interactions identified? Avoid grapefruit products  Follow-up Plan  Treatment start date: 03/12/24 Port placement date: 02/28/24 ECHO date: 02/23/24 Prescriptions sent to patient's pharmacy of choice We reviewed the prescriptions, premedications, and treatment regimen with the patient. Possible side effects of the treatment regimen were reviewed and management strategies were discussed.  Can use over-the-counter (OTC) options of loperamide (Imodium) as needed for diarrhea, loratadine (Claritin) as needed for G-CSF bone pain, and docusate + senna (Senna-S) as needed for constipation.  *** Clinical pharmacy will assist Dr. Amber Stalls and Whitney Villegas on an as needed basis going forward  Whitney Villegas participated in the discussion, expressed understanding, and voiced agreement with the above plan. [name] The patient was advised to contact the clinic at [phone] with any questions or concerns prior to her return visit.   I spent 60 minutes assessing the patient.  Fadi Menter A. Villegas, PharmD, BCOP, CPP  Whitney Villegas, RPH-CPP, 03/06/2024 5:08 AM  **Disclaimer: This note was dictated with voice recognition software. Similar sounding words can inadvertently be transcribed and this note may contain transcription errors which may not have been corrected upon publication of note.**

## 2024-03-07 ENCOUNTER — Other Ambulatory Visit: Payer: Self-pay

## 2024-03-07 ENCOUNTER — Encounter: Payer: Self-pay | Admitting: *Deleted

## 2024-03-11 MED FILL — Fosaprepitant Dimeglumine For IV Infusion 150 MG (Base Eq): INTRAVENOUS | Qty: 5 | Status: AC

## 2024-03-12 ENCOUNTER — Inpatient Hospital Stay (HOSPITAL_BASED_OUTPATIENT_CLINIC_OR_DEPARTMENT_OTHER): Payer: Medicare (Managed Care) | Admitting: Adult Health

## 2024-03-12 ENCOUNTER — Encounter: Payer: Self-pay | Admitting: Hematology and Oncology

## 2024-03-12 ENCOUNTER — Inpatient Hospital Stay: Payer: Medicare (Managed Care)

## 2024-03-12 ENCOUNTER — Encounter: Payer: Self-pay | Admitting: Adult Health

## 2024-03-12 VITALS — BP 143/76 | HR 78 | Temp 98.1°F | Resp 20

## 2024-03-12 VITALS — BP 148/69 | HR 71 | Temp 97.7°F | Resp 18 | Ht 69.0 in | Wt 234.2 lb

## 2024-03-12 DIAGNOSIS — C50512 Malignant neoplasm of lower-outer quadrant of left female breast: Secondary | ICD-10-CM | POA: Diagnosis not present

## 2024-03-12 DIAGNOSIS — Z171 Estrogen receptor negative status [ER-]: Secondary | ICD-10-CM

## 2024-03-12 DIAGNOSIS — Z5112 Encounter for antineoplastic immunotherapy: Secondary | ICD-10-CM | POA: Diagnosis not present

## 2024-03-12 LAB — CBC WITH DIFFERENTIAL (CANCER CENTER ONLY)
Abs Immature Granulocytes: 0.05 K/uL (ref 0.00–0.07)
Basophils Absolute: 0 K/uL (ref 0.0–0.1)
Basophils Relative: 0 %
Eosinophils Absolute: 0 K/uL (ref 0.0–0.5)
Eosinophils Relative: 0 %
HCT: 34.6 % — ABNORMAL LOW (ref 36.0–46.0)
Hemoglobin: 11.4 g/dL — ABNORMAL LOW (ref 12.0–15.0)
Immature Granulocytes: 1 %
Lymphocytes Relative: 16 %
Lymphs Abs: 1.4 K/uL (ref 0.7–4.0)
MCH: 23.1 pg — ABNORMAL LOW (ref 26.0–34.0)
MCHC: 32.9 g/dL (ref 30.0–36.0)
MCV: 70.2 fL — ABNORMAL LOW (ref 80.0–100.0)
Monocytes Absolute: 1 K/uL (ref 0.1–1.0)
Monocytes Relative: 12 %
Neutro Abs: 6.1 K/uL (ref 1.7–7.7)
Neutrophils Relative %: 71 %
Platelet Count: 201 K/uL (ref 150–400)
RBC: 4.93 MIL/uL (ref 3.87–5.11)
RDW: 15.9 % — ABNORMAL HIGH (ref 11.5–15.5)
WBC Count: 8.5 K/uL (ref 4.0–10.5)
nRBC: 0 % (ref 0.0–0.2)

## 2024-03-12 LAB — CMP (CANCER CENTER ONLY)
ALT: 11 U/L (ref 0–44)
AST: 28 U/L (ref 15–41)
Albumin: 4.1 g/dL (ref 3.5–5.0)
Alkaline Phosphatase: 50 U/L (ref 38–126)
Anion gap: 5 (ref 5–15)
BUN: 18 mg/dL (ref 8–23)
CO2: 30 mmol/L (ref 22–32)
Calcium: 9.7 mg/dL (ref 8.9–10.3)
Chloride: 103 mmol/L (ref 98–111)
Creatinine: 0.67 mg/dL (ref 0.44–1.00)
GFR, Estimated: >60 mL/min (ref >60)
Glucose, Bld: 109 mg/dL — ABNORMAL HIGH (ref 70–99)
Potassium: 4 mmol/L (ref 3.5–5.1)
Sodium: 138 mmol/L (ref 135–145)
Total Bilirubin: 0.7 mg/dL (ref 0.0–1.2)
Total Protein: 8 g/dL (ref 6.5–8.1)

## 2024-03-12 LAB — TSH: TSH: 1.21 u[IU]/mL (ref 0.350–4.500)

## 2024-03-12 MED ORDER — SODIUM CHLORIDE 0.9 % IV SOLN
200.0000 mg | Freq: Once | INTRAVENOUS | Status: AC
  Administered 2024-03-12: 200 mg via INTRAVENOUS
  Filled 2024-03-12: qty 200

## 2024-03-12 MED ORDER — SODIUM CHLORIDE 0.9 % IV SOLN
150.0000 mg | Freq: Once | INTRAVENOUS | Status: AC
  Administered 2024-03-12: 150 mg via INTRAVENOUS
  Filled 2024-03-12: qty 150

## 2024-03-12 MED ORDER — PALONOSETRON HCL INJECTION 0.25 MG/5ML
0.2500 mg | Freq: Once | INTRAVENOUS | Status: AC
  Administered 2024-03-12: 0.25 mg via INTRAVENOUS
  Filled 2024-03-12: qty 5

## 2024-03-12 MED ORDER — DEXAMETHASONE SOD PHOSPHATE PF 10 MG/ML IJ SOLN
10.0000 mg | Freq: Once | INTRAMUSCULAR | Status: AC
  Administered 2024-03-12: 10 mg via INTRAVENOUS

## 2024-03-12 MED ORDER — SODIUM CHLORIDE 0.9 % IV SOLN
586.5000 mg | Freq: Once | INTRAVENOUS | Status: AC
  Administered 2024-03-12: 590 mg via INTRAVENOUS
  Filled 2024-03-12: qty 59

## 2024-03-12 MED ORDER — SODIUM CHLORIDE 0.9 % IV SOLN: INTRAVENOUS | Status: DC

## 2024-03-12 MED ORDER — SODIUM CHLORIDE 0.9 % IV SOLN
75.0000 mg/m2 | Freq: Once | INTRAVENOUS | Status: DC
  Filled 2024-03-12: qty 16

## 2024-03-12 NOTE — Patient Instructions (Addendum)
 [facility] - A DEPT OF [facility]  Discharge Instructions: Thank you for choosing [facility] to provide your oncology and hematology care.   If you have a lab appointment with the Cancer Center, please go directly to the Cancer Center and check in at the registration area.   Wear comfortable clothing and clothing appropriate for easy access to any Portacath or PICC line.   We strive to give you quality time with your provider. You may need to reschedule your appointment if you arrive late (15 or more minutes).  Arriving late affects you and other patients whose appointments are after yours.  Also, if you miss three or more appointments without notifying the office, you may be dismissed from the clinic at the provider's discretion.      For prescription refill requests, have your pharmacy contact our office and allow 72 hours for refills to be completed.    Today you received the following chemotherapy and/or immunotherapy agents: Keytruda, Docetaxel, Carboplatin      To help prevent nausea and vomiting after your treatment, we encourage you to take your nausea medication as directed.  BELOW ARE SYMPTOMS THAT SHOULD BE REPORTED IMMEDIATELY: *FEVER GREATER THAN 100.4 F (38 C) OR HIGHER *CHILLS OR SWEATING *NAUSEA AND VOMITING THAT IS NOT CONTROLLED WITH YOUR NAUSEA MEDICATION *UNUSUAL SHORTNESS OF BREATH *UNUSUAL BRUISING OR BLEEDING *URINARY PROBLEMS (pain or burning when urinating, or frequent urination) *BOWEL PROBLEMS (unusual diarrhea, constipation, pain near the anus) TENDERNESS IN MOUTH AND THROAT WITH OR WITHOUT PRESENCE OF ULCERS (sore throat, sores in mouth, or a toothache) UNUSUAL RASH, SWELLING OR PAIN  UNUSUAL VAGINAL DISCHARGE OR ITCHING   Items with * indicate a potential emergency and should be followed up as soon as possible or go to the Emergency Department if any problems should occur.  Please show the CHEMOTHERAPY ALERT  CARD or IMMUNOTHERAPY ALERT CARD at check-in to the Emergency Department and triage nurse.  Should you have questions after your visit or need to cancel or reschedule your appointment, please contact [facility] - A DEPT OF [facility]  Dept: [phone]  and follow the prompts.  Office hours are 8:00 a.m. to 4:30 p.m. Monday - Friday. Please note that voicemails left after 4:00 p.m. may not be returned until the following business day.  We are closed weekends and major holidays. You have access to a nurse at all times for urgent questions. Please call the main number to the clinic Dept: [phone] and follow the prompts.   For any non-urgent questions, you may also contact your provider using MyChart. We now offer e-Visits for anyone 98 and older to request care online for non-urgent symptoms. For details visit [email or web address].   Also download the MyChart app! Go to the app store, search MyChart, open the app, select [place], and log in with your MyChart username and password.  Pembrolizumab Injection What is this medication? PEMBROLIZUMAB (PEM broe LIZ ue mab) treats some types of cancer. It works by helping your immune system slow or stop the spread of cancer cells. It is a monoclonal antibody. This medicine may be used for other purposes; ask your health care provider or pharmacist if you have questions. COMMON BRAND NAME(S): Keytruda What should I tell my care team before I take this medication? They need to know if you have any of these conditions: Allogeneic stem cell transplant (uses someone else's  stem cells) Autoimmune diseases, such as Crohn disease, ulcerative colitis, lupus History of chest radiation Nervous system problems, such as Guillain-Barre syndrome, myasthenia gravis Organ transplant An unusual or allergic reaction to pembrolizumab, other medications, foods, dyes, or preservatives Pregnant or trying to get  pregnant Breast-feeding How should I use this medication? This medication is injected into a vein. It is given by your care team in a hospital or clinic setting. A special MedGuide will be given to you before each treatment. Be sure to read this information carefully each time. Talk to your care team about the use of this medication in children. While it may be prescribed for children as young as 6 months for selected conditions, precautions do apply. Overdosage: If you think you have taken too much of this medicine contact a poison control center or emergency room at once. NOTE: This medicine is only for you. Do not share this medicine with others. What if I miss a dose? Keep appointments for follow-up doses. It is important not to miss your dose. Call your care team if you are unable to keep an appointment. What may interact with this medication? Interactions have not been studied. This list may not describe all possible interactions. Give your health care provider a list of all the medicines, herbs, non-prescription drugs, or dietary supplements you use. Also tell them if you smoke, drink alcohol, or use illegal drugs. Some items may interact with your medicine. What should I watch for while using this medication? Your condition will be monitored carefully while you are receiving this medication. You may need blood work while taking this medication. This medication may cause serious skin reactions. They can happen weeks to months after starting the medication. Contact your care team right away if you notice fevers or flu-like symptoms with a rash. The rash may be red or purple and then turn into blisters or peeling of the skin. You may also notice a red rash with swelling of the face, lips, or lymph nodes in your neck or under your arms. Tell your care team right away if you have any change in your eyesight. Talk to your care team if you may be pregnant. Serious birth defects can occur if you  take this medication during pregnancy and for 4 months after the last dose. You will need a negative pregnancy test before starting this medication. Contraception is recommended while taking this medication and for 4 months after the last dose. Your care team can help you find the option that works for you. Do not breastfeed while taking this medication and for 4 months after the last dose. What side effects may I notice from receiving this medication? Side effects that you should report to your care team as soon as possible: Allergic reactions--skin rash, itching, hives, swelling of the face, lips, tongue, or throat Dry cough, shortness of breath or trouble breathing Eye pain, redness, irritation, or discharge with blurry or decreased vision Heart muscle inflammation--unusual weakness or fatigue, shortness of breath, chest pain, fast or irregular heartbeat, dizziness, swelling of the ankles, feet, or hands Hormone gland problems--headache, sensitivity to light, unusual weakness or fatigue, dizziness, fast or irregular heartbeat, increased sensitivity to cold or heat, excessive sweating, constipation, hair loss, increased thirst or amount of urine, tremors or shaking, irritability Infusion reactions--chest pain, shortness of breath or trouble breathing, feeling faint or lightheaded Kidney injury (glomerulonephritis)--decrease in the amount of urine, red or dark brown urine, foamy or bubbly urine, swelling of the ankles, hands,  or feet Liver injury--right upper belly pain, loss of appetite, nausea, light-colored stool, dark yellow or brown urine, yellowing skin or eyes, unusual weakness or fatigue Pain, tingling, or numbness in the hands or feet, muscle weakness, change in vision, confusion or trouble speaking, loss of balance or coordination, trouble walking, seizures Rash, fever, and swollen lymph nodes Redness, blistering, peeling, or loosening of the skin, including inside the mouth Sudden or  severe stomach pain, bloody diarrhea, fever, nausea, vomiting Side effects that usually do not require medical attention (report to your care team if they continue or are bothersome): Bone, joint, or muscle pain Diarrhea Fatigue Loss of appetite Nausea Skin rash This list may not describe all possible side effects. Call your doctor for medical advice about side effects. You may report side effects to FDA at 1-800-FDA-1088. Where should I keep my medication? This medication is given in a hospital or clinic. It will not be stored at home. NOTE: This sheet is a summary. It may not cover all possible information. If you have questions about this medicine, talk to your doctor, pharmacist, or health care provider.  2024 Elsevier/Gold Standard (2021-09-21 00:00:00)  Docetaxel Injection What is this medication? DOCETAXEL (doe se TAX el) treats some types of cancer. It works by slowing down the growth of cancer cells. This medicine may be used for other purposes; ask your health care provider or pharmacist if you have questions. COMMON BRAND NAME(S): BEIZRAY, Docefrez, Docivyx, Taxotere What should I tell my care team before I take this medication? They need to know if you have any of these conditions: Kidney disease Liver disease Low white blood cell levels Tingling of the fingers or toes or other nerve disorder An unusual or allergic reaction to docetaxel, polysorbate 80, other medications, foods, dyes, or preservatives Pregnant or trying to get pregnant Breast-feeding How should I use this medication? This medication is injected into a vein. It is given by your care team in a hospital or clinic setting. Talk to your care team about the use of this medication in children. Special care may be needed. Overdosage: If you think you have taken too much of this medicine contact a poison control center or emergency room at once. NOTE: This medicine is only for you. Do not share this medicine with  others. What if I miss a dose? Keep appointments for follow-up doses. It is important not to miss your dose. Call your care team if you are unable to keep an appointment. What may interact with this medication? Do not take this medication with any of the following: Live virus vaccines This medication may also interact with the following: Certain antibiotics, such as clarithromycin, telithromycin Certain antivirals for HIV or hepatitis Certain medications for fungal infections, such as itraconazole, ketoconazole, voriconazole Grapefruit juice Nefazodone Supplements, such as St. John's wort This list may not describe all possible interactions. Give your health care provider a list of all the medicines, herbs, non-prescription drugs, or dietary supplements you use. Also tell them if you smoke, drink alcohol, or use illegal drugs. Some items may interact with your medicine. What should I watch for while using this medication? This medication may make you feel generally unwell. This is not uncommon as chemotherapy can affect healthy cells as well as cancer cells. Report any side effects. Continue your course of treatment even though you feel ill unless your care team tells you to stop. You may need blood work done while you are taking this medication. This medication can  cause serious side effects and infusion reactions. To reduce the risk, your care team may give you other medications to take before receiving this one. Be sure to follow the directions from your care team. This medication may increase your risk of getting an infection. Call your care team for advice if you get a fever, chills, sore throat, or other symptoms of a cold or flu. Do not treat yourself. Try to avoid being around people who are sick. Avoid taking medications that contain aspirin, acetaminophen , ibuprofen, naproxen , or ketoprofen unless instructed by your care team. These medications may hide a fever. Be careful brushing or  flossing your teeth or using a toothpick because you may get an infection or bleed more easily. If you have any dental work done, tell your dentist you are receiving this medication. Some products may contain alcohol. Ask your care team if this medication contains alcohol. Be sure to tell all care teams you are taking this medicine. Certain medications, like metronidazole and disulfiram, can cause an unpleasant reaction when taken with alcohol. The reaction includes flushing, headache, nausea, vomiting, sweating, and increased thirst. The reaction can last from 30 minutes to several hours. This medication may affect your coordination, reaction time, or judgement. Do not drive or operate machinery until you know how this medication affects you. Sit up or stand slowly to reduce the risk of dizzy or fainting spells. Drinking alcohol with this medication can increase the risk of these side effects. Talk to your care team about your risk of cancer. You may be more at risk for certain types of cancer if you take this medication. Talk to your care team if you wish to become pregnant or think you might be pregnant. This medication can cause serious birth defects if taken during pregnancy or if you get pregnant within 2 months after stopping therapy. A negative pregnancy test is required before starting this medication. A reliable form of contraception is recommended while taking this medication and for 2 months after stopping it. Talk to your care team about reliable forms of contraception. Do not breast-feed while taking this medication and for 1 week after stopping therapy. Use a condom during sex and for 4 months after stopping therapy. Tell your care team right away if you think your partner might be pregnant. This medication can cause serious birth defects. This medication may cause infertility. Talk to your care team if you are concerned about your fertility. What side effects may I notice from receiving this  medication? Side effects that you should report to your care team as soon as possible: Allergic reactions--skin rash, itching, hives, swelling of the face, lips, tongue, or throat Change in vision such as blurry vision, seeing halos around lights, vision loss Infection--fever, chills, cough, or sore throat Infusion reactions--chest pain, shortness of breath or trouble breathing, feeling faint or lightheaded Low red blood cell level--unusual weakness or fatigue, dizziness, headache, trouble breathing Pain, tingling, or numbness in the hands or feet Painful swelling, warmth, or redness of the skin, blisters or sores at the infusion site Redness, blistering, peeling, or loosening of the skin, including inside the mouth Sudden or severe stomach pain, bloody diarrhea, fever, nausea, vomiting Swelling of the ankles, hands, or feet Tumor lysis syndrome (TLS)--nausea, vomiting, diarrhea, decrease in the amount of urine, dark urine, unusual weakness or fatigue, confusion, muscle pain or cramps, fast or irregular heartbeat, joint pain Unusual bruising or bleeding Side effects that usually do not require medical attention (report to your  care team if they continue or are bothersome): Change in nail shape, thickness, or color Change in taste Hair loss Increased tears This list may not describe all possible side effects. Call your doctor for medical advice about side effects. You may report side effects to FDA at 1-800-FDA-1088. Where should I keep my medication? This medication is given in a hospital or clinic. It will not be stored at home. NOTE: This sheet is a summary. It may not cover all possible information. If you have questions about this medicine, talk to your doctor, pharmacist, or health care provider.  2024 Elsevier/Gold Standard (2021-07-15 00:00:00)  Carboplatin Injection What is this medication? CARBOPLATIN (KAR boe pla tin) treats some types of cancer. It works by slowing down the  growth of cancer cells. This medicine may be used for other purposes; ask your health care provider or pharmacist if you have questions. COMMON BRAND NAME(S): Paraplatin What should I tell my care team before I take this medication? They need to know if you have any of these conditions: Blood disorders Hearing problems Kidney disease Recent or ongoing radiation therapy An unusual or allergic reaction to carboplatin, cisplatin, other medications, foods, dyes, or preservatives Pregnant or trying to get pregnant Breast-feeding How should I use this medication? This medication is injected into a vein. It is given by your care team in a hospital or clinic setting. Talk to your care team about the use of this medication in children. Special care may be needed. Overdosage: If you think you have taken too much of this medicine contact a poison control center or emergency room at once. NOTE: This medicine is only for you. Do not share this medicine with others. What if I miss a dose? Keep appointments for follow-up doses. It is important not to miss your dose. Call your care team if you are unable to keep an appointment. What may interact with this medication? Medications for seizures Some antibiotics, such as amikacin, gentamicin, neomycin, streptomycin, tobramycin Vaccines This list may not describe all possible interactions. Give your health care provider a list of all the medicines, herbs, non-prescription drugs, or dietary supplements you use. Also tell them if you smoke, drink alcohol, or use illegal drugs. Some items may interact with your medicine. What should I watch for while using this medication? Your condition will be monitored carefully while you are receiving this medication. You may need blood work while taking this medication. This medication may make you feel generally unwell. This is not uncommon, as chemotherapy can affect healthy cells as well as cancer cells. Report any side  effects. Continue your course of treatment even though you feel ill unless your care team tells you to stop. In some cases, you may be given additional medications to help with side effects. Follow all directions for their use. This medication may increase your risk of getting an infection. Call your care team for advice if you get a fever, chills, sore throat, or other symptoms of a cold or flu. Do not treat yourself. Try to avoid being around people who are sick. Avoid taking medications that contain aspirin, acetaminophen , ibuprofen, naproxen , or ketoprofen unless instructed by your care team. These medications may hide a fever. Be careful brushing or flossing your teeth or using a toothpick because you may get an infection or bleed more easily. If you have any dental work done, tell your dentist you are receiving this medication. Talk to your care team if you wish to become pregnant or  think you might be pregnant. This medication can cause serious birth defects. Talk to your care team about effective forms of contraception. Do not breast-feed while taking this medication. What side effects may I notice from receiving this medication? Side effects that you should report to your care team as soon as possible: Allergic reactions--skin rash, itching, hives, swelling of the face, lips, tongue, or throat Infection--fever, chills, cough, sore throat, wounds that don't heal, pain or trouble when passing urine, general feeling of discomfort or being unwell Low red blood cell level--unusual weakness or fatigue, dizziness, headache, trouble breathing Pain, tingling, or numbness in the hands or feet, muscle weakness, change in vision, confusion or trouble speaking, loss of balance or coordination, trouble walking, seizures Unusual bruising or bleeding Side effects that usually do not require medical attention (report to your care team if they continue or are bothersome): Hair loss Nausea Unusual weakness or  fatigue Vomiting This list may not describe all possible side effects. Call your doctor for medical advice about side effects. You may report side effects to FDA at 1-800-FDA-1088. Where should I keep my medication? This medication is given in a hospital or clinic. It will not be stored at home. NOTE: This sheet is a summary. It may not cover all possible information. If you have questions about this medicine, talk to your doctor, pharmacist, or health care provider.  2024 Elsevier/Gold Standard (2021-08-31 00:00:00)

## 2024-03-12 NOTE — Progress Notes (Addendum)
 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  1 LN biopsied and positive, ER+/PR+/HER2-, Ki-67 50%.    02/15/2024 Cancer Staging   Staging form: Breast, AJCC 8th Edition - Clinical stage from 02/15/2024: Stage IIIB (cT2, cN1, cM0, G3, ER-, PR-, HER2-) - Signed by Crawford Morna Pickle, NP on 03/15/2024 Stage prefix: Initial diagnosis Histologic grading system: 3 grade system   02/27/2024 Imaging   Staging scans with CT chest/abdomen/pelvis and bone scan: negative for metastatic disease.    02/28/2024 Genetic Testing   Negative genetic testing PALB2 c.1833C>A (p.Asp611Glu) VUS POLE c.5942A>G (p.Asn1981Ser) VUS  The Common Hereditary Gene Panel offered by [facility] includes sequencing and/or deletion duplication testing of the following 48 genes: APC, ATM, AXIN2, BAP1, BARD1, BMPR1A, BRCA1, BRCA2, BRIP1, CDH1, CDK4, CDKN2A (p14ARF), CDKN2A (p16INK4a), CHEK2, CTNNA1, DICER1, EPCAM (Deletion/duplication testing only), GREM1 (promoter region deletion/duplication testing only), KIT, MEN1, MLH1, MSH2, MSH3, MSH6, MUTYH, NBN, NF1, NHTL1, PALB2, PDGFRA, PMS2, POLD1, POLE, PTEN, RAD50, RAD51C, RAD51D, SDHB, SDHC, SDHD, SMAD4, SMARCA4. STK11, TP53, TSC1, TSC2, and VHL.  The following genes were evaluated  for sequence changes only: SDHA and HOXB13 c.251G>A variant only.    03/12/2024 - 03/12/2024 Chemotherapy   Patient is on Treatment Plan : BREAST Pembrolizumab (200) D1 + Carboplatin (1.5) D1,8,15 + Paclitaxel (80) D1,8,15 q21d X 4 cycles / Pembrolizumab (200) D1 + AC D1 q21d x 4 cycles     03/12/2024 -  Chemotherapy   Patient is on Treatment Plan : Breast Pembrolizumab (200) + Docetaxel (75) + Carboplatin (AUC 6) q21d x 6 cycles       CURRENT THERAPY:Taxotere/Carbo/Keytruda  INTERVAL HISTORY:  Discussed the use of AI scribe software for clinical note transcription with the patient, who gave verbal consent to proceed.  History of Present Illness Navil Kole is a 68 year old female with triple negative breast cancer who presents for neoadjuvant chemotherapy.  She is receiving neoadjuvant chemotherapy with carboplatin, docetaxel, and pembrolizumab. This is her first time on this regimen. She has no fever, chills, cough, or shortness of breath. Her appetite is good, and bowel movements are normal. A port is in place and functioning well.  She takes dexamethasone, two tablets in the morning and two in the evening, starting the day before chemotherapy and continuing for two to three days post-treatment. She adjusts her work schedule to accommodate her chemotherapy, taking time off on Wednesdays and Thursdays following her Tuesday treatments.     Patient Active Problem List   Diagnosis Date Noted   Genetic testing 02/29/2024   Family history of breast cancer    History of breast cancer    Malignant neoplasm of lower-outer quadrant of left breast of female, estrogen receptor negative (HCC) 02/20/2024    has no known allergies.  MEDICAL HISTORY: Past Medical History:  Diagnosis Date  Family history of breast cancer    History of breast cancer     SURGICAL HISTORY: Past Surgical History:  Procedure Laterality Date   BREAST BIOPSY Left 02/15/2024   US  LT BREAST BX W LOC DEV 1ST  LESION IMG BX SPEC US  GUIDE 02/15/2024 [facility] MAMMOGRAPHY   MASTECTOMY     PORTACATH PLACEMENT N/A 02/28/2024   Procedure: INSERTION, TUNNELED CENTRAL VENOUS DEVICE, WITH PORT;  Surgeon: Ebbie Cough, MD;  Location: [facility] OR;  Service: General;  Laterality: N/A;  PORT PLACEMENT WITH ULTRASOUND GUIDANCE    SOCIAL HISTORY: Social History   Socioeconomic History   Marital status: Single    Spouse name: Not on file   Number of children: Not on file   Years of education: Not on file   Highest education level: Not on file  Occupational History   Not on file  Tobacco Use   Smoking status: Former    Types: Cigarettes   Smokeless tobacco: Not on file  Substance and Sexual Activity   Alcohol use: No   Drug use: Never   Sexual activity: Not on file  Other Topics Concern   Not on file  Social History Narrative   Not on file   Social Drivers of Health   Financial Resource Strain: Not on file  Food Insecurity: No Food Insecurity (03/01/2024)   Hunger Vital Sign    Worried About Running Out of Food in the Last Year: Never true    Ran Out of Food in the Last Year: Never true  Transportation Needs: No Transportation Needs (03/01/2024)   PRAPARE - Administrator, Civil Service (Medical): No    Lack of Transportation (Non-Medical): No  Physical Activity: Not on file  Stress: Not on file  Social Connections: Not on file  Intimate Partner Violence: Not on file    FAMILY HISTORY: Family History  Problem Relation Age of Onset   Cirrhosis Father    Breast cancer Sister    Kidney failure Brother     Review of Systems  Constitutional:  Negative for appetite change, chills, fatigue, fever and unexpected weight change.  HENT:   Negative for hearing loss, lump/mass and trouble swallowing.   Eyes:  Negative for eye problems and icterus.  Respiratory:  Negative for chest tightness, cough and shortness of breath.   Cardiovascular:  Negative for chest pain, leg swelling and  palpitations.  Gastrointestinal:  Negative for abdominal distention, abdominal pain, constipation, diarrhea, nausea and vomiting.  Endocrine: Negative for hot flashes.  Genitourinary:  Negative for difficulty urinating.   Musculoskeletal:  Negative for arthralgias.  Skin:  Negative for itching and rash.  Neurological:  Negative for dizziness, extremity weakness, headaches and numbness.  Hematological:  Negative for adenopathy. Does not bruise/bleed easily.  Psychiatric/Behavioral:  Negative for depression. The patient is not nervous/anxious.       PHYSICAL EXAMINATION     Vitals:   03/12/24 1206  BP: (!) 148/69  Pulse: 71  Resp: 18  Temp: 97.7 F (36.5 C)  SpO2: 99%    Physical Exam Constitutional:      General: She is not in acute distress.    Appearance: Normal appearance. She is not toxic-appearing.  HENT:     Head: Normocephalic and atraumatic.     Mouth/Throat:     Mouth: Mucous membranes are moist.     Pharynx: Oropharynx is clear. No oropharyngeal exudate or posterior oropharyngeal erythema.  Eyes:     General: No scleral icterus. Cardiovascular:  Rate and Rhythm: Normal rate and regular rhythm.     Pulses: Normal pulses.     Heart sounds: Normal heart sounds.  Pulmonary:     Effort: Pulmonary effort is normal.     Breath sounds: Normal breath sounds.  Abdominal:     General: Abdomen is flat. Bowel sounds are normal. There is no distension.     Palpations: Abdomen is soft.     Tenderness: There is no abdominal tenderness.  Musculoskeletal:        General: No swelling.     Cervical back: Neck supple.  Lymphadenopathy:     Cervical: No cervical adenopathy.  Skin:    General: Skin is warm and dry.     Findings: No rash.  Neurological:     General: No focal deficit present.     Mental Status: She is alert.  Psychiatric:        Mood and Affect: Mood normal.        Behavior: Behavior normal.     LABORATORY DATA:  CBC    Component Value  Date/Time   WBC 8.5 03/12/2024 1045   WBC 7.9 02/12/2024 2017   RBC 4.93 03/12/2024 1045   HGB 11.4 (L) 03/12/2024 1045   HCT 34.6 (L) 03/12/2024 1045   PLT 201 03/12/2024 1045   MCV 70.2 (L) 03/12/2024 1045   MCH 23.1 (L) 03/12/2024 1045   MCHC 32.9 03/12/2024 1045   RDW 15.9 (H) 03/12/2024 1045   LYMPHSABS 1.4 03/12/2024 1045   MONOABS 1.0 03/12/2024 1045   EOSABS 0.0 03/12/2024 1045   BASOSABS 0.0 03/12/2024 1045    CMP     Component Value Date/Time   NA 138 03/12/2024 1045   K 4.0 03/12/2024 1045   CL 103 03/12/2024 1045   CO2 30 03/12/2024 1045   GLUCOSE 109 (H) 03/12/2024 1045   BUN 18 03/12/2024 1045   CREATININE 0.67 03/12/2024 1045   CALCIUM 9.7 03/12/2024 1045   PROT 8.0 03/12/2024 1045   ALBUMIN 4.1 03/12/2024 1045   AST 28 03/12/2024 1045   ALT 11 03/12/2024 1045   ALKPHOS 50 03/12/2024 1045   BILITOT 0.7 03/12/2024 1045   GFRNONAA >60 03/12/2024 1045     ASSESSMENT and THERAPY PLAN:    Assessment and Plan Assessment & Plan Triple negative breast cancer Undergoing neoadjuvant chemotherapy with carboplatin, Taxotere, and Keytrud.  Staging scans negative. Goal: curative therapy, tumor regression followed by surgery and radiation.  - Administer carboplatin, Taxotere, and Keytruda; labs stable, will proceed with treatment today without any dose modifications.  - Schedule follow-up and labs in one week to assess chemotherapy tolerance and adjust treatment if needed before November 11th. - Resume dexamethasone, 2 tablets morning and evening, for 2-3 days post-chemotherapy.  RTC in 1 week for labs, f/u.  All questions were answered. The patient knows to call the clinic with any problems, questions or concerns. We can certainly see the patient much sooner if necessary.  Total encounter time:30 minutes*in face-to-face visit time, chart review, lab review, care coordination, order entry, and documentation of the encounter time.    Morna Kendall, NP 03/15/24  9:57 PM Medical Oncology and Hematology [facility] [address] [place], [place] [address] Tel. [phone]    Fax. [phone]  *Total Encounter Time as defined by the Centers for Medicare and Medicaid Services includes, in addition to the face-to-face time of a patient visit (documented in the note above) non-face-to-face time: obtaining and reviewing outside history, ordering  and reviewing medications, tests or procedures, care coordination (communications with other health care professionals or caregivers) and documentation in the medical record.

## 2024-03-12 NOTE — Patient Instructions (Signed)

## 2024-03-13 ENCOUNTER — Other Ambulatory Visit: Payer: Self-pay

## 2024-03-13 ENCOUNTER — Telehealth: Payer: Self-pay

## 2024-03-13 LAB — T4: T4, Total: 7.4 ug/dL (ref 4.5–12.0)

## 2024-03-13 NOTE — Telephone Encounter (Signed)
 Dr. Loretha, first time Keytruda, Taxotere, Carbo f/u call - pt tolerated well Received: Nilsa Metro Katrinka DELENA, RN  P Onc Triage Nurse [facility] Caller: Unspecified Tanis,  5:47 PM)

## 2024-03-14 ENCOUNTER — Inpatient Hospital Stay: Payer: Medicare (Managed Care)

## 2024-03-14 VITALS — BP 147/74 | HR 82 | Temp 99.0°F | Resp 19

## 2024-03-14 DIAGNOSIS — Z5112 Encounter for antineoplastic immunotherapy: Secondary | ICD-10-CM | POA: Diagnosis not present

## 2024-03-14 DIAGNOSIS — Z171 Estrogen receptor negative status [ER-]: Secondary | ICD-10-CM

## 2024-03-14 MED ORDER — PEGFILGRASTIM-CBQV 6 MG/0.6ML ~~LOC~~ SOSY
6.0000 mg | PREFILLED_SYRINGE | Freq: Once | SUBCUTANEOUS | Status: AC
  Administered 2024-03-14: 6 mg via SUBCUTANEOUS
  Filled 2024-03-14: qty 0.6

## 2024-03-15 ENCOUNTER — Encounter: Payer: Self-pay | Admitting: Hematology and Oncology

## 2024-03-18 ENCOUNTER — Encounter: Payer: Self-pay | Admitting: *Deleted

## 2024-03-18 ENCOUNTER — Other Ambulatory Visit: Payer: Self-pay | Admitting: *Deleted

## 2024-03-20 ENCOUNTER — Inpatient Hospital Stay: Payer: Medicare (Managed Care)

## 2024-03-20 ENCOUNTER — Other Ambulatory Visit: Payer: Self-pay

## 2024-03-20 ENCOUNTER — Inpatient Hospital Stay (HOSPITAL_BASED_OUTPATIENT_CLINIC_OR_DEPARTMENT_OTHER): Payer: Medicare (Managed Care) | Admitting: Adult Health

## 2024-03-20 VITALS — BP 141/71 | HR 92 | Temp 97.6°F | Resp 18 | Ht 69.0 in | Wt 226.8 lb

## 2024-03-20 DIAGNOSIS — Z171 Estrogen receptor negative status [ER-]: Secondary | ICD-10-CM

## 2024-03-20 DIAGNOSIS — C50512 Malignant neoplasm of lower-outer quadrant of left female breast: Secondary | ICD-10-CM | POA: Diagnosis not present

## 2024-03-20 DIAGNOSIS — Z5112 Encounter for antineoplastic immunotherapy: Secondary | ICD-10-CM | POA: Diagnosis not present

## 2024-03-20 LAB — CMP (CANCER CENTER ONLY)
ALT: 23 U/L (ref 0–44)
AST: 34 U/L (ref 15–41)
Albumin: 4 g/dL (ref 3.5–5.0)
Alkaline Phosphatase: 48 U/L (ref 38–126)
Anion gap: 7 (ref 5–15)
BUN: 20 mg/dL (ref 8–23)
CO2: 27 mmol/L (ref 22–32)
Calcium: 8.8 mg/dL — ABNORMAL LOW (ref 8.9–10.3)
Chloride: 100 mmol/L (ref 98–111)
Creatinine: 0.83 mg/dL (ref 0.44–1.00)
GFR, Estimated: >60 mL/min (ref >60)
Glucose, Bld: 115 mg/dL — ABNORMAL HIGH (ref 70–99)
Potassium: 4.2 mmol/L (ref 3.5–5.1)
Sodium: 134 mmol/L — ABNORMAL LOW (ref 135–145)
Total Bilirubin: 0.5 mg/dL (ref 0.0–1.2)
Total Protein: 7.6 g/dL (ref 6.5–8.1)

## 2024-03-20 LAB — CBC WITH DIFFERENTIAL (CANCER CENTER ONLY)
Abs Immature Granulocytes: 0 K/uL (ref 0.00–0.07)
Basophils Absolute: 0 K/uL (ref 0.0–0.1)
Basophils Relative: 1 %
Eosinophils Absolute: 0 K/uL (ref 0.0–0.5)
Eosinophils Relative: 1 %
HCT: 32.1 % — ABNORMAL LOW (ref 36.0–46.0)
Hemoglobin: 10.7 g/dL — ABNORMAL LOW (ref 12.0–15.0)
Immature Granulocytes: 0 %
Lymphocytes Relative: 44 %
Lymphs Abs: 0.8 K/uL (ref 0.7–4.0)
MCH: 23.1 pg — ABNORMAL LOW (ref 26.0–34.0)
MCHC: 33.3 g/dL (ref 30.0–36.0)
MCV: 69.3 fL — ABNORMAL LOW (ref 80.0–100.0)
Monocytes Absolute: 0.7 K/uL (ref 0.1–1.0)
Monocytes Relative: 36 %
Neutro Abs: 0.3 K/uL — CL (ref 1.7–7.7)
Neutrophils Relative %: 18 %
Platelet Count: 181 K/uL (ref 150–400)
RBC: 4.63 MIL/uL (ref 3.87–5.11)
RDW: 15 % (ref 11.5–15.5)
WBC Count: 1.9 K/uL — ABNORMAL LOW (ref 4.0–10.5)
nRBC: 0 % (ref 0.0–0.2)

## 2024-03-22 ENCOUNTER — Encounter: Payer: Self-pay | Admitting: Adult Health

## 2024-03-22 ENCOUNTER — Encounter: Payer: Self-pay | Admitting: Hematology and Oncology

## 2024-03-22 NOTE — Progress Notes (Signed)
 [facility] Cancer Follow up:    Whitney Ash, MD [address] [place] [address]   DIAGNOSIS: Cancer Staging  Malignant neoplasm of lower-outer quadrant of left breast of female, estrogen receptor negative (HCC) Staging form: Breast, AJCC 8th Edition - Clinical stage from 02/15/2024: Stage IIIB (cT2, cN1, cM0, G3, ER-, PR-, HER2-) - Signed by Crawford Morna Pickle, NP on 03/15/2024 Stage prefix: Initial diagnosis Histologic grading system: 3 grade system    SUMMARY OF ONCOLOGIC HISTORY: Oncology History  Malignant neoplasm of lower-outer quadrant of left breast of female, estrogen receptor negative (HCC)  02/15/2024 Initial Diagnosis   Left breast needle core biopsy, 6 o'clock: IDC grade 3, 3.7cm mass like area.  1 LN biopsied and positive, ER+/PR+/HER2-, Ki-67 50%.    02/15/2024 Cancer Staging   Staging form: Breast, AJCC 8th Edition - Clinical stage from 02/15/2024: Stage IIIB (cT2, cN1, cM0, G3, ER-, PR-, HER2-) - Signed by Crawford Morna Pickle, NP on 03/15/2024 Stage prefix: Initial diagnosis Histologic grading system: 3 grade system   02/27/2024 Imaging   Staging scans with CT chest/abdomen/pelvis and bone scan: negative for metastatic disease.    02/28/2024 Genetic Testing   Negative genetic testing PALB2 c.1833C>A (p.Asp611Glu) VUS POLE c.5942A>G (p.Asn1981Ser) VUS  The Common Hereditary Gene Panel offered by [facility] includes sequencing and/or deletion duplication testing of the following 48 genes: APC, ATM, AXIN2, BAP1, BARD1, BMPR1A, BRCA1, BRCA2, BRIP1, CDH1, CDK4, CDKN2A (p14ARF), CDKN2A (p16INK4a), CHEK2, CTNNA1, DICER1, EPCAM (Deletion/duplication testing only), GREM1 (promoter region deletion/duplication testing only), KIT, MEN1, MLH1, MSH2, MSH3, MSH6, MUTYH, NBN, NF1, NHTL1, PALB2, PDGFRA, PMS2, POLD1, POLE, PTEN, RAD50, RAD51C, RAD51D, SDHB, SDHC, SDHD, SMAD4, SMARCA4. STK11, TP53, TSC1, TSC2, and VHL.  The following genes were evaluated for  sequence changes only: SDHA and HOXB13 c.251G>A variant only.    03/12/2024 - 03/12/2024 Chemotherapy   Patient is on Treatment Plan : BREAST Pembrolizumab (200) D1 + Carboplatin (1.5) D1,8,15 + Paclitaxel (80) D1,8,15 q21d X 4 cycles / Pembrolizumab (200) D1 + AC D1 q21d x 4 cycles     03/12/2024 -  Chemotherapy   Patient is on Treatment Plan : Breast Pembrolizumab (200) + Docetaxel (75) + Carboplatin (AUC 6) q21d x 6 cycles       CURRENT THERAPY:  INTERVAL HISTORY:  Discussed the use of AI scribe software for clinical note transcription with the patient, who gave verbal consent to proceed.  History of Present Illness Whitney Villegas is a 68 year old female with breast cancer undergoing chemotherapy who presents with concerns about low blood counts and shoulder pain.  She is on chemotherapy with Taxotere and carboplatin  and Keytruda neoadjuvantly. Her white blood cell count has decreased to 1.9, with a neutrophil count of 0.3. She felt well the day after chemotherapy but has been increasingly fatigued since day 2-3 following treatment.  Her appetite has decreased, and she consumes small amounts of food like crackers, bananas, and boiled eggs, along with herbal tea. She receives medications for nausea and vomiting during treatment. No numbness or tingling in her fingertips or toes.  She experiences shoulder pain, which she attributes to prolonged sleeping on it, and inquires about using topical treatments like IcyHot and a heating pad for relief.  She has normal bowel movements but notes an increase in frequency to twice a day, which she attributes to changes in eating habits and chemotherapy effects.    Patient Active Problem List   Diagnosis Date Noted   Genetic testing 02/29/2024  Family history of breast cancer    History of breast cancer    Malignant neoplasm of lower-outer quadrant of left breast of female, estrogen receptor negative (HCC) 02/20/2024    has no known  allergies.  MEDICAL HISTORY: Past Medical History:  Diagnosis Date   Family history of breast cancer    History of breast cancer     SURGICAL HISTORY: Past Surgical History:  Procedure Laterality Date   BREAST BIOPSY Left 02/15/2024   US  LT BREAST BX W LOC DEV 1ST LESION IMG BX SPEC US  GUIDE 02/15/2024 [facility] MAMMOGRAPHY   MASTECTOMY     PORTACATH PLACEMENT N/A 02/28/2024   Procedure: INSERTION, TUNNELED CENTRAL VENOUS DEVICE, WITH PORT;  Surgeon: Ebbie Cough, MD;  Location: [facility] OR;  Service: General;  Laterality: N/A;  PORT PLACEMENT WITH ULTRASOUND GUIDANCE    SOCIAL HISTORY: Social History   Socioeconomic History   Marital status: Single    Spouse name: Not on file   Number of children: Not on file   Years of education: Not on file   Highest education level: Not on file  Occupational History   Not on file  Tobacco Use   Smoking status: Former    Types: Cigarettes   Smokeless tobacco: Not on file  Substance and Sexual Activity   Alcohol use: No   Drug use: Never   Sexual activity: Not on file  Other Topics Concern   Not on file  Social History Narrative   Not on file   Social Drivers of Health   Financial Resource Strain: Not on file  Food Insecurity: No Food Insecurity (03/01/2024)   Hunger Vital Sign    Worried About Running Out of Food in the Last Year: Never true    Ran Out of Food in the Last Year: Never true  Transportation Needs: No Transportation Needs (03/01/2024)   PRAPARE - Administrator, Civil Service (Medical): No    Lack of Transportation (Non-Medical): No  Physical Activity: Not on file  Stress: Not on file  Social Connections: Not on file  Intimate Partner Violence: Not on file    FAMILY HISTORY: Family History  Problem Relation Age of Onset   Cirrhosis Father    Breast cancer Sister    Kidney failure Brother     Review of Systems  Constitutional:  Positive for fatigue. Negative for appetite change, chills, fever  and unexpected weight change.  HENT:   Negative for hearing loss, lump/mass and trouble swallowing.   Eyes:  Negative for eye problems and icterus.  Respiratory:  Negative for chest tightness, cough and shortness of breath.   Cardiovascular:  Negative for chest pain, leg swelling and palpitations.  Gastrointestinal:  Negative for abdominal distention, abdominal pain, constipation, diarrhea, nausea and vomiting.  Endocrine: Negative for hot flashes.  Genitourinary:  Negative for difficulty urinating.   Musculoskeletal:  Negative for arthralgias.  Skin:  Negative for itching and rash.  Neurological:  Negative for dizziness, extremity weakness, headaches and numbness.  Hematological:  Negative for adenopathy. Does not bruise/bleed easily.  Psychiatric/Behavioral:  Negative for depression. The patient is not nervous/anxious.       PHYSICAL EXAMINATION    Vitals:   03/20/24 1109  BP: (!) 141/71  Pulse: 92  Resp: 18  Temp: 97.6 F (36.4 C)  SpO2: 100%    Physical Exam Constitutional:      General: She is not in acute distress.    Appearance: Normal appearance. She is not toxic-appearing.  HENT:     Head: Normocephalic and atraumatic.     Mouth/Throat:     Mouth: Mucous membranes are moist.     Pharynx: Oropharynx is clear. No oropharyngeal exudate or posterior oropharyngeal erythema.  Eyes:     General: No scleral icterus. Cardiovascular:     Rate and Rhythm: Normal rate and regular rhythm.     Pulses: Normal pulses.     Heart sounds: Normal heart sounds.  Pulmonary:     Effort: Pulmonary effort is normal.     Breath sounds: Normal breath sounds.  Abdominal:     General: Abdomen is flat. Bowel sounds are normal. There is no distension.     Palpations: Abdomen is soft.     Tenderness: There is no abdominal tenderness.  Musculoskeletal:        General: No swelling.     Cervical back: Neck supple.  Lymphadenopathy:     Cervical: No cervical adenopathy.  Skin:     General: Skin is warm and dry.     Findings: No rash.  Neurological:     General: No focal deficit present.     Mental Status: She is alert.  Psychiatric:        Mood and Affect: Mood normal.        Behavior: Behavior normal.     LABORATORY DATA:  CBC    Component Value Date/Time   WBC 1.9 (L) 03/20/2024 0954   WBC 7.9 02/12/2024 2017   RBC 4.63 03/20/2024 0954   HGB 10.7 (L) 03/20/2024 0954   HCT 32.1 (L) 03/20/2024 0954   PLT 181 03/20/2024 0954   MCV 69.3 (L) 03/20/2024 0954   MCH 23.1 (L) 03/20/2024 0954   MCHC 33.3 03/20/2024 0954   RDW 15.0 03/20/2024 0954   LYMPHSABS 0.8 03/20/2024 0954   MONOABS 0.7 03/20/2024 0954   EOSABS 0.0 03/20/2024 0954   BASOSABS 0.0 03/20/2024 0954    CMP     Component Value Date/Time   NA 134 (L) 03/20/2024 0954   K 4.2 03/20/2024 0954   CL 100 03/20/2024 0954   CO2 27 03/20/2024 0954   GLUCOSE 115 (H) 03/20/2024 0954   BUN 20 03/20/2024 0954   CREATININE 0.83 03/20/2024 0954   CALCIUM 8.8 (L) 03/20/2024 0954   PROT 7.6 03/20/2024 0954   ALBUMIN 4.0 03/20/2024 0954   AST 34 03/20/2024 0954   ALT 23 03/20/2024 0954   ALKPHOS 48 03/20/2024 0954   BILITOT 0.5 03/20/2024 0954   GFRNONAA >60 03/20/2024 0954     ASSESSMENT and THERAPY PLAN:   Assessment and Plan Assessment & Plan Breast cancer Breast cancer in the right breast, undergoing neoadjuvant chemotherapy with Keytruda Taxotere and carboplatin.  - Tolerated treatment moderately well, with side effects including chemo induced fatigue, and chemotherapy induced neutropenia - Educated on limiting sugar intake during and after chemotherapy.  Chemotherapy-induced neutropenia Neutropenia with WBC count of 1.9 and ANC of 0.3, increasing infection risk. Expected chemotherapy side effect, should resolve in days. - Instructed on neutropenic precautions: avoid crowds, practice hand hygiene. - Advised to call immediately if fever or chills develop. - Instructed to go to ER if  unable to reach office in case of fever or chills.  Chemotherapy-induced fatigue.  Increased tiredness the week following chemotherapy.   - Reviewed and recommended energy conservation  Chemotherapy-induced decreased appetite Decreased appetite due to chemotherapy. She forces herself to eat small amounts. - Advised to eat small portions: crackers, bananas, boiled eggs. - Recommended  herbal tea, limit sugar intake.  Right shoulder pain Right shoulder pain likely from sleeping position. Inquired about topical treatments. - Advised use of IcyHot or similar topical treatment. - Approved use of heating pad. - Recommended consideration of a crescent pillow to help with shoulder pain. - Instructed to call if pain persists or worsens.  All questions were answered. The patient knows to call the clinic with any problems, questions or concerns. We can certainly see the patient much sooner if necessary.  Total encounter time:40 minutes*in face-to-face visit time, chart review, lab review, care coordination, order entry, and documentation of the encounter time.  Morna Kendall, NP 03/22/24 7:35 PM Medical Oncology and Hematology [facility] [address] [place], [place] [address] Tel. [phone]    Fax. [phone]  *Total Encounter Time as defined by the Centers for Medicare and Medicaid Services includes, in addition to the face-to-face time of a patient visit (documented in the note above) non-face-to-face time: obtaining and reviewing outside history, ordering and reviewing medications, tests or procedures, care coordination (communications with other health care professionals or caregivers) and documentation in the medical record.

## 2024-04-01 MED FILL — Fosaprepitant Dimeglumine For IV Infusion 150 MG (Base Eq): INTRAVENOUS | Qty: 5 | Status: AC

## 2024-04-02 ENCOUNTER — Inpatient Hospital Stay: Payer: Medicare (Managed Care)

## 2024-04-02 ENCOUNTER — Inpatient Hospital Stay (HOSPITAL_BASED_OUTPATIENT_CLINIC_OR_DEPARTMENT_OTHER): Payer: Medicare (Managed Care) | Admitting: Hematology and Oncology

## 2024-04-02 ENCOUNTER — Inpatient Hospital Stay: Payer: Medicare (Managed Care) | Attending: Hematology and Oncology

## 2024-04-02 VITALS — BP 132/63 | HR 86 | Temp 97.6°F | Resp 17 | Wt 225.5 lb

## 2024-04-02 DIAGNOSIS — Z17411 Hormone receptor positive with human epidermal growth factor receptor 2 negative status: Secondary | ICD-10-CM | POA: Diagnosis not present

## 2024-04-02 DIAGNOSIS — C50512 Malignant neoplasm of lower-outer quadrant of left female breast: Secondary | ICD-10-CM | POA: Insufficient documentation

## 2024-04-02 DIAGNOSIS — Z171 Estrogen receptor negative status [ER-]: Secondary | ICD-10-CM

## 2024-04-02 DIAGNOSIS — D649 Anemia, unspecified: Secondary | ICD-10-CM | POA: Insufficient documentation

## 2024-04-02 DIAGNOSIS — Z5111 Encounter for antineoplastic chemotherapy: Secondary | ICD-10-CM | POA: Diagnosis present

## 2024-04-02 DIAGNOSIS — Z95828 Presence of other vascular implants and grafts: Secondary | ICD-10-CM | POA: Insufficient documentation

## 2024-04-02 DIAGNOSIS — Z5189 Encounter for other specified aftercare: Secondary | ICD-10-CM | POA: Diagnosis not present

## 2024-04-02 DIAGNOSIS — Z5112 Encounter for antineoplastic immunotherapy: Secondary | ICD-10-CM | POA: Diagnosis present

## 2024-04-02 LAB — CMP (CANCER CENTER ONLY)
ALT: 12 U/L (ref 0–44)
AST: 17 U/L (ref 15–41)
Albumin: 4 g/dL (ref 3.5–5.0)
Alkaline Phosphatase: 70 U/L (ref 38–126)
Anion gap: 7 (ref 5–15)
BUN: 17 mg/dL (ref 8–23)
CO2: 27 mmol/L (ref 22–32)
Calcium: 9.1 mg/dL (ref 8.9–10.3)
Chloride: 105 mmol/L (ref 98–111)
Creatinine: 0.59 mg/dL (ref 0.44–1.00)
GFR, Estimated: >60 mL/min (ref >60)
Glucose, Bld: 127 mg/dL — ABNORMAL HIGH (ref 70–99)
Potassium: 3.8 mmol/L (ref 3.5–5.1)
Sodium: 139 mmol/L (ref 135–145)
Total Bilirubin: 0.5 mg/dL (ref 0.0–1.2)
Total Protein: 8 g/dL (ref 6.5–8.1)

## 2024-04-02 LAB — CBC WITH DIFFERENTIAL (CANCER CENTER ONLY)
Abs Immature Granulocytes: 0.09 K/uL — ABNORMAL HIGH (ref 0.00–0.07)
Basophils Absolute: 0 K/uL (ref 0.0–0.1)
Basophils Relative: 0 %
Eosinophils Absolute: 0 K/uL (ref 0.0–0.5)
Eosinophils Relative: 0 %
HCT: 31.6 % — ABNORMAL LOW (ref 36.0–46.0)
Hemoglobin: 10.4 g/dL — ABNORMAL LOW (ref 12.0–15.0)
Immature Granulocytes: 1 %
Lymphocytes Relative: 10 %
Lymphs Abs: 1.3 K/uL (ref 0.7–4.0)
MCH: 23 pg — ABNORMAL LOW (ref 26.0–34.0)
MCHC: 32.9 g/dL (ref 30.0–36.0)
MCV: 69.8 fL — ABNORMAL LOW (ref 80.0–100.0)
Monocytes Absolute: 0.3 K/uL (ref 0.1–1.0)
Monocytes Relative: 2 %
Neutro Abs: 11 K/uL — ABNORMAL HIGH (ref 1.7–7.7)
Neutrophils Relative %: 87 %
Platelet Count: 194 K/uL (ref 150–400)
RBC: 4.53 MIL/uL (ref 3.87–5.11)
RDW: 16.4 % — ABNORMAL HIGH (ref 11.5–15.5)
WBC Count: 12.7 K/uL — ABNORMAL HIGH (ref 4.0–10.5)
nRBC: 0 % (ref 0.0–0.2)

## 2024-04-02 MED ORDER — SODIUM CHLORIDE 0.9 % IV SOLN
150.0000 mg | Freq: Once | INTRAVENOUS | Status: AC
  Administered 2024-04-02: 150 mg via INTRAVENOUS
  Filled 2024-04-02: qty 150

## 2024-04-02 MED ORDER — SODIUM CHLORIDE 0.9% FLUSH
10.0000 mL | Freq: Once | INTRAVENOUS | Status: AC
  Administered 2024-04-02: 10 mL

## 2024-04-02 MED ORDER — PALONOSETRON HCL INJECTION 0.25 MG/5ML
0.2500 mg | Freq: Once | INTRAVENOUS | Status: AC
  Administered 2024-04-02: 0.25 mg via INTRAVENOUS
  Filled 2024-04-02: qty 5

## 2024-04-02 MED ORDER — SODIUM CHLORIDE 0.9 % IV SOLN
580.0000 mg | Freq: Once | INTRAVENOUS | Status: AC
  Administered 2024-04-02: 580 mg via INTRAVENOUS
  Filled 2024-04-02: qty 58

## 2024-04-02 MED ORDER — SODIUM CHLORIDE 0.9 % IV SOLN
200.0000 mg | Freq: Once | INTRAVENOUS | Status: AC
  Administered 2024-04-02: 200 mg via INTRAVENOUS
  Filled 2024-04-02: qty 200

## 2024-04-02 MED ORDER — SODIUM CHLORIDE 0.9 % IV SOLN: INTRAVENOUS | Status: DC

## 2024-04-02 MED ORDER — SODIUM CHLORIDE 0.9% FLUSH
10.0000 mL | INTRAVENOUS | Status: DC | PRN
  Administered 2024-04-02: 10 mL

## 2024-04-02 MED ORDER — SODIUM CHLORIDE 0.9 % IV SOLN
75.0000 mg/m2 | Freq: Once | INTRAVENOUS | Status: AC
  Administered 2024-04-02: 160 mg via INTRAVENOUS
  Filled 2024-04-02: qty 16

## 2024-04-02 MED ORDER — DEXAMETHASONE SOD PHOSPHATE PF 10 MG/ML IJ SOLN
10.0000 mg | Freq: Once | INTRAMUSCULAR | Status: AC
  Administered 2024-04-02: 10 mg via INTRAVENOUS

## 2024-04-02 NOTE — Patient Instructions (Signed)
 [facility] - A DEPT OF [facility]  Discharge Instructions: Thank you for choosing [facility] to provide your oncology and hematology care.   If you have a lab appointment with the Cancer Center, please go directly to the Cancer Center and check in at the registration area.   Wear comfortable clothing and clothing appropriate for easy access to any Portacath or PICC line.   We strive to give you quality time with your provider. You may need to reschedule your appointment if you arrive late (15 or more minutes).  Arriving late affects you and other patients whose appointments are after yours.  Also, if you miss three or more appointments without notifying the office, you may be dismissed from the clinic at the provider's discretion.      For prescription refill requests, have your pharmacy contact our office and allow 72 hours for refills to be completed.    Today you received the following chemotherapy and/or immunotherapy agents: Keytruda, Docetaxel, and Carboplatin      To help prevent nausea and vomiting after your treatment, we encourage you to take your nausea medication as directed.  BELOW ARE SYMPTOMS THAT SHOULD BE REPORTED IMMEDIATELY: *FEVER GREATER THAN 100.4 F (38 C) OR HIGHER *CHILLS OR SWEATING *NAUSEA AND VOMITING THAT IS NOT CONTROLLED WITH YOUR NAUSEA MEDICATION *UNUSUAL SHORTNESS OF BREATH *UNUSUAL BRUISING OR BLEEDING *URINARY PROBLEMS (pain or burning when urinating, or frequent urination) *BOWEL PROBLEMS (unusual diarrhea, constipation, pain near the anus) TENDERNESS IN MOUTH AND THROAT WITH OR WITHOUT PRESENCE OF ULCERS (sore throat, sores in mouth, or a toothache) UNUSUAL RASH, SWELLING OR PAIN  UNUSUAL VAGINAL DISCHARGE OR ITCHING   Items with * indicate a potential emergency and should be followed up as soon as possible or go to the Emergency Department if any problems should occur.  Please show the CHEMOTHERAPY  ALERT CARD or IMMUNOTHERAPY ALERT CARD at check-in to the Emergency Department and triage nurse.  Should you have questions after your visit or need to cancel or reschedule your appointment, please contact [facility] - A DEPT OF [facility]  Dept: [phone]  and follow the prompts.  Office hours are 8:00 a.m. to 4:30 p.m. Monday - Friday. Please note that voicemails left after 4:00 p.m. may not be returned until the following business day.  We are closed weekends and major holidays. You have access to a nurse at all times for urgent questions. Please call the main number to the clinic Dept: [phone] and follow the prompts.   For any non-urgent questions, you may also contact your provider using MyChart. We now offer e-Visits for anyone 79 and older to request care online for non-urgent symptoms. For details visit [email or web address].   Also download the MyChart app! Go to the app store, search MyChart, open the app, select [facility], and log in with your MyChart username and password.

## 2024-04-02 NOTE — Progress Notes (Signed)
 [facility] Cancer Follow up:    Whitney Ash, MD [address] [place] [address]   DIAGNOSIS:  Cancer Staging  Malignant neoplasm of lower-outer quadrant of left breast of female, estrogen receptor negative (HCC) Staging form: Breast, AJCC 8th Edition - Clinical stage from 02/15/2024: Stage IIIB (cT2, cN1, cM0, G3, ER-, PR-, HER2-) - Signed by Crawford Morna Pickle, NP on 03/15/2024 Stage prefix: Initial diagnosis Histologic grading system: 3 grade system    SUMMARY OF ONCOLOGIC HISTORY: Oncology History  Malignant neoplasm of lower-outer quadrant of left breast of female, estrogen receptor negative (HCC)  02/15/2024 Initial Diagnosis   Left breast needle core biopsy, 6 o'clock: IDC grade 3, 3.7cm mass like area.  1 LN biopsied and positive, ER+/PR+/HER2-, Ki-67 50%.    02/15/2024 Cancer Staging   Staging form: Breast, AJCC 8th Edition - Clinical stage from 02/15/2024: Stage IIIB (cT2, cN1, cM0, G3, ER-, PR-, HER2-) - Signed by Crawford Morna Pickle, NP on 03/15/2024 Stage prefix: Initial diagnosis Histologic grading system: 3 grade system   02/27/2024 Imaging   Staging scans with CT chest/abdomen/pelvis and bone scan: negative for metastatic disease.    02/28/2024 Genetic Testing   Negative genetic testing PALB2 c.1833C>A (p.Asp611Glu) VUS POLE c.5942A>G (p.Asn1981Ser) VUS  The Common Hereditary Gene Panel offered by [facility] includes sequencing and/or deletion duplication testing of the following 48 genes: APC, ATM, AXIN2, BAP1, BARD1, BMPR1A, BRCA1, BRCA2, BRIP1, CDH1, CDK4, CDKN2A (p14ARF), CDKN2A (p16INK4a), CHEK2, CTNNA1, DICER1, EPCAM (Deletion/duplication testing only), GREM1 (promoter region deletion/duplication testing only), KIT, MEN1, MLH1, MSH2, MSH3, MSH6, MUTYH, NBN, NF1, NHTL1, PALB2, PDGFRA, PMS2, POLD1, POLE, PTEN, RAD50, RAD51C, RAD51D, SDHB, SDHC, SDHD, SMAD4, SMARCA4. STK11, TP53, TSC1, TSC2, and VHL.  The following genes were evaluated  for sequence changes only: SDHA and HOXB13 c.251G>A variant only.    03/12/2024 - 03/12/2024 Chemotherapy   Patient is on Treatment Plan : BREAST Pembrolizumab (200) D1 + Carboplatin (1.5) D1,8,15 + Paclitaxel (80) D1,8,15 q21d X 4 cycles / Pembrolizumab (200) D1 + AC D1 q21d x 4 cycles     03/12/2024 -  Chemotherapy   Patient is on Treatment Plan : Breast Pembrolizumab (200) + Docetaxel (75) + Carboplatin (AUC 6) q21d x 6 cycles       CURRENT THERAPY:  INTERVAL HISTORY:  Discussed the use of AI scribe software for clinical note transcription with the patient, who gave verbal consent to proceed.  History of Present Illness  Whitney Villegas is a 68 year old female with breast cancer who presents for follow-up after her first chemotherapy session with carboplatin/docetaxel and keytruda.  She recently completed her first chemotherapy session, experiencing a generally positive outcome with the exception of significant discomfort from a white blood cell shot, which was administered to prevent infections.  Following the chemotherapy, she experienced a loss of appetite lasting approximately one and a half weeks. During this time, her diet was limited to oranges, bananas, cran-apple juice, and water. She consumes one banana and two oranges daily and avoids concentrated sugars, preferring natural sugars from fruits. She enjoys cabbage, green beans, and sweet peas.  She is currently taking Claritin and Tylenol  to manage bone pain associated with the white blood cell shot. She uses bandages to manage irritation from her nipple, which sometimes protrudes more than usual.  No issues with breathing, bowel movements, nausea, or vomiting.   Patient Active Problem List   Diagnosis Date Noted   Port-A-Cath in place 04/02/2024   Genetic testing 02/29/2024  Family history of breast cancer    History of breast cancer    Malignant neoplasm of lower-outer quadrant of left breast of female, estrogen  receptor negative (HCC) 02/20/2024    has no known allergies.  MEDICAL HISTORY: Past Medical History:  Diagnosis Date   Family history of breast cancer    History of breast cancer     SURGICAL HISTORY: Past Surgical History:  Procedure Laterality Date   BREAST BIOPSY Left 02/15/2024   US  LT BREAST BX W LOC DEV 1ST LESION IMG BX SPEC US  GUIDE 02/15/2024 [facility] MAMMOGRAPHY   MASTECTOMY     PORTACATH PLACEMENT N/A 02/28/2024   Procedure: INSERTION, TUNNELED CENTRAL VENOUS DEVICE, WITH PORT;  Surgeon: Ebbie Cough, MD;  Location: [facility] OR;  Service: General;  Laterality: N/A;  PORT PLACEMENT WITH ULTRASOUND GUIDANCE    SOCIAL HISTORY: Social History   Socioeconomic History   Marital status: Single    Spouse name: Not on file   Number of children: Not on file   Years of education: Not on file   Highest education level: Not on file  Occupational History   Not on file  Tobacco Use   Smoking status: Former    Types: Cigarettes   Smokeless tobacco: Not on file  Substance and Sexual Activity   Alcohol use: No   Drug use: Never   Sexual activity: Not on file  Other Topics Concern   Not on file  Social History Narrative   Not on file   Social Drivers of Health   Financial Resource Strain: Not on file  Food Insecurity: No Food Insecurity (03/01/2024)   Hunger Vital Sign    Worried About Running Out of Food in the Last Year: Never true    Ran Out of Food in the Last Year: Never true  Transportation Needs: No Transportation Needs (03/01/2024)   PRAPARE - Administrator, Civil Service (Medical): No    Lack of Transportation (Non-Medical): No  Physical Activity: Not on file  Stress: Not on file  Social Connections: Not on file  Intimate Partner Violence: Not on file    FAMILY HISTORY: Family History  Problem Relation Age of Onset   Cirrhosis Father    Breast cancer Sister    Kidney failure Brother       PHYSICAL EXAMINATION    Vitals:   04/02/24  1138  BP: 132/63  Pulse: 86  Resp: 17  Temp: 97.6 F (36.4 C)  SpO2: 100%    Physical Exam Constitutional:      General: She is not in acute distress.    Appearance: Normal appearance. She is not toxic-appearing.  HENT:     Head: Normocephalic and atraumatic.     Mouth/Throat:     Mouth: Mucous membranes are moist.     Pharynx: Oropharynx is clear. No oropharyngeal exudate or posterior oropharyngeal erythema.  Eyes:     General: No scleral icterus. Cardiovascular:     Rate and Rhythm: Normal rate and regular rhythm.     Pulses: Normal pulses.     Heart sounds: Normal heart sounds.  Pulmonary:     Effort: Pulmonary effort is normal.     Breath sounds: Normal breath sounds.  Chest:     Comments: Left breast mass with some softening in the upper quadrants. She still has significant breast mass with some nipple ulceration. Palpable left axillary adenopathy Abdominal:     General: Abdomen is flat. Bowel sounds are normal. There is no  distension.     Palpations: Abdomen is soft.     Tenderness: There is no abdominal tenderness.  Musculoskeletal:        General: No swelling.     Cervical back: Neck supple.  Lymphadenopathy:     Cervical: No cervical adenopathy.  Skin:    General: Skin is warm and dry.     Findings: No rash.  Neurological:     General: No focal deficit present.     Mental Status: She is alert.  Psychiatric:        Mood and Affect: Mood normal.        Behavior: Behavior normal.     LABORATORY DATA:  CBC    Component Value Date/Time   WBC 12.7 (H) 04/02/2024 1110   WBC 7.9 02/12/2024 2017   RBC 4.53 04/02/2024 1110   HGB 10.4 (L) 04/02/2024 1110   HCT 31.6 (L) 04/02/2024 1110   PLT 194 04/02/2024 1110   MCV 69.8 (L) 04/02/2024 1110   MCH 23.0 (L) 04/02/2024 1110   MCHC 32.9 04/02/2024 1110   RDW 16.4 (H) 04/02/2024 1110   LYMPHSABS 1.3 04/02/2024 1110   MONOABS 0.3 04/02/2024 1110   EOSABS 0.0 04/02/2024 1110   BASOSABS 0.0 04/02/2024 1110     CMP     Component Value Date/Time   NA 134 (L) 03/20/2024 0954   K 4.2 03/20/2024 0954   CL 100 03/20/2024 0954   CO2 27 03/20/2024 0954   GLUCOSE 115 (H) 03/20/2024 0954   BUN 20 03/20/2024 0954   CREATININE 0.83 03/20/2024 0954   CALCIUM 8.8 (L) 03/20/2024 0954   PROT 7.6 03/20/2024 0954   ALBUMIN 4.0 03/20/2024 0954   AST 34 03/20/2024 0954   ALT 23 03/20/2024 0954   ALKPHOS 48 03/20/2024 0954   BILITOT 0.5 03/20/2024 0954   GFRNONAA >60 03/20/2024 0954     ASSESSMENT and THERAPY PLAN:   Assessment and Plan Assessment & Plan  Malignant neoplasm of lower-outer quadrant of left breast Chemotherapy well-tolerated with minor adverse effects. Tumor response positive, softer on examination. Blood counts stable. Goal: tumor shrinkage for surgical removal. - Continue chemotherapy regimen. - Some evidence of response noted. - Encouraged balanced diet with natural sugars from fruits. - Plan is to complete 6 cycles followed by mastectomy and targeted node dissection   GCSF induced arthralgias Ok to continue claritin and tylenol .  Normocytic normochromic anemia likely related to chemo. No indication for transfusion Ok to monitor  Time spent: 30 min  All questions were answered. The patient knows to call the clinic with any problems, questions or concerns. We can certainly see the patient much sooner if necessary.  Total encounter time:40 minutes*in face-to-face visit time, chart review, lab review, care coordination, order entry, and documentation of the encounter time.    *Total Encounter Time as defined by the Centers for Medicare and Medicaid Services includes, in addition to the face-to-face time of a patient visit (documented in the note above) non-face-to-face time: obtaining and reviewing outside history, ordering and reviewing medications, tests or procedures, care coordination (communications with other health care professionals or caregivers) and documentation  in the medical record.

## 2024-04-03 ENCOUNTER — Other Ambulatory Visit: Payer: Self-pay

## 2024-04-04 ENCOUNTER — Inpatient Hospital Stay: Payer: Medicare (Managed Care)

## 2024-04-04 VITALS — BP 135/76 | HR 78 | Temp 98.9°F | Resp 18

## 2024-04-04 DIAGNOSIS — C50512 Malignant neoplasm of lower-outer quadrant of left female breast: Secondary | ICD-10-CM

## 2024-04-04 DIAGNOSIS — Z5112 Encounter for antineoplastic immunotherapy: Secondary | ICD-10-CM | POA: Diagnosis not present

## 2024-04-04 MED ORDER — PEGFILGRASTIM-CBQV 6 MG/0.6ML ~~LOC~~ SOSY
6.0000 mg | PREFILLED_SYRINGE | Freq: Once | SUBCUTANEOUS | Status: AC
  Administered 2024-04-04: 6 mg via SUBCUTANEOUS
  Filled 2024-04-04: qty 0.6

## 2024-04-19 ENCOUNTER — Encounter: Payer: Self-pay | Admitting: *Deleted

## 2024-04-22 ENCOUNTER — Encounter: Payer: Self-pay | Admitting: Hematology and Oncology

## 2024-04-22 MED FILL — Fosaprepitant Dimeglumine For IV Infusion 150 MG (Base Eq): INTRAVENOUS | Qty: 5 | Status: AC

## 2024-04-23 ENCOUNTER — Inpatient Hospital Stay: Payer: Medicare (Managed Care) | Attending: Hematology and Oncology

## 2024-04-23 ENCOUNTER — Inpatient Hospital Stay: Payer: Medicare (Managed Care) | Attending: Hematology and Oncology | Admitting: Hematology and Oncology

## 2024-04-23 ENCOUNTER — Inpatient Hospital Stay: Payer: Medicare (Managed Care)

## 2024-04-23 VITALS — BP 136/68 | HR 77 | Temp 97.9°F | Resp 18 | Wt 219.7 lb

## 2024-04-23 VITALS — BP 116/64 | HR 76 | Resp 17

## 2024-04-23 DIAGNOSIS — Z5189 Encounter for other specified aftercare: Secondary | ICD-10-CM | POA: Diagnosis not present

## 2024-04-23 DIAGNOSIS — T451X5A Adverse effect of antineoplastic and immunosuppressive drugs, initial encounter: Secondary | ICD-10-CM | POA: Diagnosis not present

## 2024-04-23 DIAGNOSIS — R110 Nausea: Secondary | ICD-10-CM | POA: Insufficient documentation

## 2024-04-23 DIAGNOSIS — C50512 Malignant neoplasm of lower-outer quadrant of left female breast: Secondary | ICD-10-CM | POA: Insufficient documentation

## 2024-04-23 DIAGNOSIS — Z5112 Encounter for antineoplastic immunotherapy: Secondary | ICD-10-CM | POA: Insufficient documentation

## 2024-04-23 DIAGNOSIS — D696 Thrombocytopenia, unspecified: Secondary | ICD-10-CM | POA: Insufficient documentation

## 2024-04-23 DIAGNOSIS — R630 Anorexia: Secondary | ICD-10-CM | POA: Diagnosis not present

## 2024-04-23 DIAGNOSIS — Z171 Estrogen receptor negative status [ER-]: Secondary | ICD-10-CM | POA: Diagnosis not present

## 2024-04-23 DIAGNOSIS — Z5111 Encounter for antineoplastic chemotherapy: Secondary | ICD-10-CM | POA: Diagnosis present

## 2024-04-23 DIAGNOSIS — Z17421 Hormone receptor negative with human epidermal growth factor receptor 2 negative status: Secondary | ICD-10-CM | POA: Insufficient documentation

## 2024-04-23 DIAGNOSIS — D649 Anemia, unspecified: Secondary | ICD-10-CM | POA: Diagnosis not present

## 2024-04-23 LAB — CBC WITH DIFFERENTIAL (CANCER CENTER ONLY)
Abs Immature Granulocytes: 0.04 K/uL (ref 0.00–0.07)
Basophils Absolute: 0.1 K/uL (ref 0.0–0.1)
Basophils Relative: 1 %
Eosinophils Absolute: 0.1 K/uL (ref 0.0–0.5)
Eosinophils Relative: 1 %
HCT: 29.3 % — ABNORMAL LOW (ref 36.0–46.0)
Hemoglobin: 9.7 g/dL — ABNORMAL LOW (ref 12.0–15.0)
Immature Granulocytes: 1 %
Lymphocytes Relative: 22 %
Lymphs Abs: 1.4 K/uL (ref 0.7–4.0)
MCH: 23.4 pg — ABNORMAL LOW (ref 26.0–34.0)
MCHC: 33.1 g/dL (ref 30.0–36.0)
MCV: 70.6 fL — ABNORMAL LOW (ref 80.0–100.0)
Monocytes Absolute: 0.6 K/uL (ref 0.1–1.0)
Monocytes Relative: 10 %
Neutro Abs: 4.3 K/uL (ref 1.7–7.7)
Neutrophils Relative %: 65 %
Platelet Count: 146 K/uL — ABNORMAL LOW (ref 150–400)
RBC: 4.15 MIL/uL (ref 3.87–5.11)
RDW: 18.6 % — ABNORMAL HIGH (ref 11.5–15.5)
WBC Count: 6.5 K/uL (ref 4.0–10.5)
nRBC: 0 % (ref 0.0–0.2)

## 2024-04-23 LAB — CMP (CANCER CENTER ONLY)
ALT: 15 U/L (ref 0–44)
AST: 24 U/L (ref 15–41)
Albumin: 3.9 g/dL (ref 3.5–5.0)
Alkaline Phosphatase: 72 U/L (ref 38–126)
Anion gap: 10 (ref 5–15)
BUN: 12 mg/dL (ref 8–23)
CO2: 26 mmol/L (ref 22–32)
Calcium: 8.8 mg/dL — ABNORMAL LOW (ref 8.9–10.3)
Chloride: 105 mmol/L (ref 98–111)
Creatinine: 0.54 mg/dL (ref 0.44–1.00)
GFR, Estimated: >60 mL/min (ref >60)
Glucose, Bld: 92 mg/dL (ref 70–99)
Potassium: 3.7 mmol/L (ref 3.5–5.1)
Sodium: 141 mmol/L (ref 135–145)
Total Bilirubin: 0.5 mg/dL (ref 0.0–1.2)
Total Protein: 7.1 g/dL (ref 6.5–8.1)

## 2024-04-23 LAB — TSH: TSH: 1.14 u[IU]/mL (ref 0.350–4.500)

## 2024-04-23 MED ORDER — SODIUM CHLORIDE 0.9 % IV SOLN
75.0000 mg/m2 | Freq: Once | INTRAVENOUS | Status: AC
  Administered 2024-04-23: 160 mg via INTRAVENOUS
  Filled 2024-04-23: qty 16

## 2024-04-23 MED ORDER — PALONOSETRON HCL INJECTION 0.25 MG/5ML
0.2500 mg | Freq: Once | INTRAVENOUS | Status: AC
  Administered 2024-04-23: 0.25 mg via INTRAVENOUS
  Filled 2024-04-23: qty 5

## 2024-04-23 MED ORDER — SODIUM CHLORIDE 0.9 % IV SOLN
580.0000 mg | Freq: Once | INTRAVENOUS | Status: AC
  Administered 2024-04-23: 580 mg via INTRAVENOUS
  Filled 2024-04-23: qty 58

## 2024-04-23 MED ORDER — SODIUM CHLORIDE 0.9 % IV SOLN: INTRAVENOUS | Status: DC

## 2024-04-23 MED ORDER — SODIUM CHLORIDE 0.9 % IV SOLN
200.0000 mg | Freq: Once | INTRAVENOUS | Status: AC
  Administered 2024-04-23: 200 mg via INTRAVENOUS
  Filled 2024-04-23: qty 200

## 2024-04-23 MED ORDER — DEXAMETHASONE SOD PHOSPHATE PF 10 MG/ML IJ SOLN
10.0000 mg | Freq: Once | INTRAMUSCULAR | Status: AC
  Administered 2024-04-23: 10 mg via INTRAVENOUS

## 2024-04-23 MED ORDER — SODIUM CHLORIDE 0.9 % IV SOLN
150.0000 mg | Freq: Once | INTRAVENOUS | Status: AC
  Administered 2024-04-23: 150 mg via INTRAVENOUS
  Filled 2024-04-23: qty 150

## 2024-04-23 NOTE — Progress Notes (Signed)
 [facility] Cancer Follow up:    Whitney Ash, MD [address] [place] [address]   DIAGNOSIS:  Cancer Staging  Malignant neoplasm of lower-outer quadrant of left breast of female, estrogen receptor negative (HCC) Staging form: Breast, AJCC 8th Edition - Clinical stage from 02/15/2024: Stage IIIB (cT2, cN1, cM0, G3, ER-, PR-, HER2-) - Signed by Crawford Morna Pickle, NP on 03/15/2024 Stage prefix: Initial diagnosis Histologic grading system: 3 grade system    SUMMARY OF ONCOLOGIC HISTORY: Oncology History  Malignant neoplasm of lower-outer quadrant of left breast of female, estrogen receptor negative (HCC)  02/15/2024 Initial Diagnosis   Left breast needle core biopsy, 6 o'clock: IDC grade 3, 3.7cm mass like area.  1 LN biopsied and positive, ER+/PR+/HER2-, Ki-67 50%.    02/15/2024 Cancer Staging   Staging form: Breast, AJCC 8th Edition - Clinical stage from 02/15/2024: Stage IIIB (cT2, cN1, cM0, G3, ER-, PR-, HER2-) - Signed by Crawford Morna Pickle, NP on 03/15/2024 Stage prefix: Initial diagnosis Histologic grading system: 3 grade system   02/27/2024 Imaging   Staging scans with CT chest/abdomen/pelvis and bone scan: negative for metastatic disease.    02/28/2024 Genetic Testing   Negative genetic testing PALB2 c.1833C>A (p.Asp611Glu) VUS POLE c.5942A>G (p.Asn1981Ser) VUS  The Common Hereditary Gene Panel offered by [facility] includes sequencing and/or deletion duplication testing of the following 48 genes: APC, ATM, AXIN2, BAP1, BARD1, BMPR1A, BRCA1, BRCA2, BRIP1, CDH1, CDK4, CDKN2A (p14ARF), CDKN2A (p16INK4a), CHEK2, CTNNA1, DICER1, EPCAM (Deletion/duplication testing only), GREM1 (promoter region deletion/duplication testing only), KIT, MEN1, MLH1, MSH2, MSH3, MSH6, MUTYH, NBN, NF1, NHTL1, PALB2, PDGFRA, PMS2, POLD1, POLE, PTEN, RAD50, RAD51C, RAD51D, SDHB, SDHC, SDHD, SMAD4, SMARCA4. STK11, TP53, TSC1, TSC2, and VHL.  The following genes were evaluated  for sequence changes only: SDHA and HOXB13 c.251G>A variant only.    03/12/2024 - 03/12/2024 Chemotherapy   Patient is on Treatment Plan : BREAST Pembrolizumab  (200) D1 + Carboplatin  (1.5) D1,8,15 + Paclitaxel (80) D1,8,15 q21d X 4 cycles / Pembrolizumab  (200) D1 + AC D1 q21d x 4 cycles     03/12/2024 -  Chemotherapy   Patient is on Treatment Plan : Breast Pembrolizumab  (200) + Docetaxel  (75) + Carboplatin  (AUC 6) q21d x 6 cycles       CURRENT THERAPY:  INTERVAL HISTORY:  Discussed the use of AI scribe software for clinical note transcription with the patient, who gave verbal consent to proceed.  History of Present Illness  Whitney Villegas is a 68 year old female undergoing chemotherapy who presents for follow-up of her treatment. She forgot to take her pre-chemo medication dexamethasone  and is worried about this.  She has experienced a significant loss of appetite since starting chemotherapy, leading to noticeable weight loss from 240 to 212 pounds. She attributes this to her inability to taste food and is trying to maintain nutrition by drinking water and [employer], and forcing herself to eat Chinese food, which she has always enjoyed.  She experiences nausea when taking her medication but has no vomiting, diarrhea, or fevers. She frequently feels cold. She reports numbness in her feet, which began before starting chemotherapy, with no worsening of this symptom.  Regarding her breast condition, she notes that it feels less heavy and has softened, which she finds relieving.  Rest of the pertinent 10 point ROS reviewed and neg.   Patient Active Problem List   Diagnosis Date Noted   Port-A-Cath in place 04/02/2024   Genetic testing 02/29/2024   Family history of  breast cancer    History of breast cancer    Malignant neoplasm of lower-outer quadrant of left breast of female, estrogen receptor negative (HCC) 02/20/2024    has no known allergies.  MEDICAL HISTORY: Past Medical  History:  Diagnosis Date   Family history of breast cancer    History of breast cancer     SURGICAL HISTORY: Past Surgical History:  Procedure Laterality Date   BREAST BIOPSY Left 02/15/2024   US  LT BREAST BX W LOC DEV 1ST LESION IMG BX SPEC US  GUIDE 02/15/2024 [facility] MAMMOGRAPHY   MASTECTOMY     PORTACATH PLACEMENT N/A 02/28/2024   Procedure: INSERTION, TUNNELED CENTRAL VENOUS DEVICE, WITH PORT;  Surgeon: Ebbie Cough, MD;  Location: [facility] OR;  Service: General;  Laterality: N/A;  PORT PLACEMENT WITH ULTRASOUND GUIDANCE    SOCIAL HISTORY: Social History   Socioeconomic History   Marital status: Single    Spouse name: Not on file   Number of children: Not on file   Years of education: Not on file   Highest education level: Not on file  Occupational History   Not on file  Tobacco Use   Smoking status: Former    Types: Cigarettes   Smokeless tobacco: Not on file  Substance and Sexual Activity   Alcohol use: No   Drug use: Never   Sexual activity: Not on file  Other Topics Concern   Not on file  Social History Narrative   Not on file   Social Drivers of Health   Financial Resource Strain: Not on file  Food Insecurity: No Food Insecurity (03/01/2024)   Hunger Vital Sign    Worried About Running Out of Food in the Last Year: Never true    Ran Out of Food in the Last Year: Never true  Transportation Needs: No Transportation Needs (03/01/2024)   PRAPARE - Administrator, Civil Service (Medical): No    Lack of Transportation (Non-Medical): No  Physical Activity: Not on file  Stress: Not on file  Social Connections: Not on file  Intimate Partner Violence: Not on file    FAMILY HISTORY: Family History  Problem Relation Age of Onset   Cirrhosis Father    Breast cancer Sister    Kidney failure Brother       PHYSICAL EXAMINATION    Vitals:   04/23/24 1029  BP: 136/68  Pulse: 77  Resp: 18  Temp: 97.9 F (36.6 C)  SpO2: 98%    Physical  Exam Constitutional:      General: She is not in acute distress.    Appearance: Normal appearance. She is not toxic-appearing.  HENT:     Head: Normocephalic and atraumatic.     Mouth/Throat:     Mouth: Mucous membranes are moist.     Pharynx: Oropharynx is clear. No oropharyngeal exudate or posterior oropharyngeal erythema.  Eyes:     General: No scleral icterus. Cardiovascular:     Rate and Rhythm: Normal rate and regular rhythm.     Pulses: Normal pulses.     Heart sounds: Normal heart sounds.  Pulmonary:     Effort: Pulmonary effort is normal.     Breath sounds: Normal breath sounds.  Chest:     Comments: Left breast mass with some softening in the upper quadrants.Left axillary adenopathy also appears to be smaller. Peau de orange appearance of left breast has improved. Abdominal:     General: Abdomen is flat. Bowel sounds are normal. There is no distension.  Palpations: Abdomen is soft.     Tenderness: There is no abdominal tenderness.  Musculoskeletal:        General: No swelling.     Cervical back: Neck supple.  Lymphadenopathy:     Cervical: No cervical adenopathy.  Skin:    General: Skin is warm and dry.     Findings: No rash.  Neurological:     General: No focal deficit present.     Mental Status: She is alert.  Psychiatric:        Mood and Affect: Mood normal.        Behavior: Behavior normal.     LABORATORY DATA:  CBC    Component Value Date/Time   WBC 6.5 04/23/2024 0959   WBC 7.9 02/12/2024 2017   RBC 4.15 04/23/2024 0959   HGB 9.7 (L) 04/23/2024 0959   HCT 29.3 (L) 04/23/2024 0959   PLT 146 (L) 04/23/2024 0959   MCV 70.6 (L) 04/23/2024 0959   MCH 23.4 (L) 04/23/2024 0959   MCHC 33.1 04/23/2024 0959   RDW 18.6 (H) 04/23/2024 0959   LYMPHSABS 1.4 04/23/2024 0959   MONOABS 0.6 04/23/2024 0959   EOSABS 0.1 04/23/2024 0959   BASOSABS 0.1 04/23/2024 0959    CMP     Component Value Date/Time   NA 139 04/02/2024 1110   K 3.8 04/02/2024 1110    CL 105 04/02/2024 1110   CO2 27 04/02/2024 1110   GLUCOSE 127 (H) 04/02/2024 1110   BUN 17 04/02/2024 1110   CREATININE 0.59 04/02/2024 1110   CALCIUM 9.1 04/02/2024 1110   PROT 8.0 04/02/2024 1110   ALBUMIN 4.0 04/02/2024 1110   AST 17 04/02/2024 1110   ALT 12 04/02/2024 1110   ALKPHOS 70 04/02/2024 1110   BILITOT 0.5 04/02/2024 1110   GFRNONAA >60 04/02/2024 1110     ASSESSMENT and THERAPY PLAN:   Assessment and Plan Assessment & Plan  Malignant neoplasm of lower-outer quadrant of left breast On neoadj docetaxel  and carboplatin  with Keytruda  based on NEOPACT study. We didn't think she would be a good candidate for KEYNOTE 522 regimen Breast softened, less heavy. No significant side effects except nausea with oral medications. Peripheral neuropathy stable. - Continue chemotherapy with carboplatin /docetaxel  and Keytruda . - Scheduled next chemotherapy session for December 23rd, 2025.  Chemotherapy-induced anorexia and nausea Experiencing anorexia and nausea with significant weight loss.  Poor appetite and difficulty tasting food. Nausea with oral medications. - Continue to use nausea medication PRN.  Mild anemia, microcytic and hypochromic - Start ferrous sulfate, one pill every other day. - I will order iron labs for her next visit.  Mild thrombocytopenia, ok to monitor.  Peripheral neuropathy of feet, stable Stable with no worsening of symptoms. Numbness present but not increasing.  All questions were answered. The patient knows to call the clinic with any problems, questions or concerns. We can certainly see the patient much sooner if necessary.  Total encounter time:30 minutes*in face-to-face visit time, chart review, lab review, care coordination, order entry, and documentation of the encounter time.    *Total Encounter Time as defined by the Centers for Medicare and Medicaid Services includes, in addition to the face-to-face time of a patient visit (documented  in the note above) non-face-to-face time: obtaining and reviewing outside history, ordering and reviewing medications, tests or procedures, care coordination (communications with other health care professionals or caregivers) and documentation in the medical record.

## 2024-04-23 NOTE — Patient Instructions (Signed)
 [facility] - A DEPT OF [facility]  Discharge Instructions: Thank you for choosing [facility] to provide your oncology and hematology care.   If you have a lab appointment with the Cancer Center, please go directly to the Cancer Center and check in at the registration area.   Wear comfortable clothing and clothing appropriate for easy access to any Portacath or PICC line.   We strive to give you quality time with your provider. You may need to reschedule your appointment if you arrive late (15 or more minutes).  Arriving late affects you and other patients whose appointments are after yours.  Also, if you miss three or more appointments without notifying the office, you may be dismissed from the clinic at the provider's discretion.      For prescription refill requests, have your pharmacy contact our office and allow 72 hours for refills to be completed.    Today you received the following chemotherapy and/or immunotherapy agents: Keytruda /Taxotere /Carbo      To help prevent nausea and vomiting after your treatment, we encourage you to take your nausea medication as directed.  BELOW ARE SYMPTOMS THAT SHOULD BE REPORTED IMMEDIATELY: *FEVER GREATER THAN 100.4 F (38 C) OR HIGHER *CHILLS OR SWEATING *NAUSEA AND VOMITING THAT IS NOT CONTROLLED WITH YOUR NAUSEA MEDICATION *UNUSUAL SHORTNESS OF BREATH *UNUSUAL BRUISING OR BLEEDING *URINARY PROBLEMS (pain or burning when urinating, or frequent urination) *BOWEL PROBLEMS (unusual diarrhea, constipation, pain near the anus) TENDERNESS IN MOUTH AND THROAT WITH OR WITHOUT PRESENCE OF ULCERS (sore throat, sores in mouth, or a toothache) UNUSUAL RASH, SWELLING OR PAIN  UNUSUAL VAGINAL DISCHARGE OR ITCHING   Items with * indicate a potential emergency and should be followed up as soon as possible or go to the Emergency Department if any problems should occur.  Please show the CHEMOTHERAPY ALERT CARD or  IMMUNOTHERAPY ALERT CARD at check-in to the Emergency Department and triage nurse.  Should you have questions after your visit or need to cancel or reschedule your appointment, please contact [facility] - [facility] DEPT [facility]  Dept: [phone]  and follow the prompts.  Office hours are 8:00 a.m. to 4:30 p.m. Monday - Friday. Please note that voicemails left after 4:00 p.m. may not be returned until the following business day.  We are closed weekends and major holidays. You have access to a nurse at all times for urgent questions. Please call the main number to the clinic Dept: [phone] and follow the prompts.   For any non-urgent questions, you may also contact your provider using MyChart. We now offer e-Visits for anyone 56 and older to request care online for non-urgent symptoms. For details visit [email or web address].   Also download the MyChart app! Go to the app store, search MyChart, open the app, select [place], and log in with your MyChart username and password.

## 2024-04-24 ENCOUNTER — Other Ambulatory Visit: Payer: Self-pay | Admitting: Hematology and Oncology

## 2024-04-24 LAB — T4: T4, Total: 6.5 ug/dL (ref 4.5–12.0)

## 2024-04-25 ENCOUNTER — Other Ambulatory Visit: Payer: Self-pay | Admitting: Hematology and Oncology

## 2024-04-25 ENCOUNTER — Inpatient Hospital Stay: Payer: Medicare (Managed Care)

## 2024-04-25 VITALS — BP 129/76 | HR 76 | Temp 98.8°F | Resp 16

## 2024-04-25 DIAGNOSIS — C50512 Malignant neoplasm of lower-outer quadrant of left female breast: Secondary | ICD-10-CM

## 2024-04-25 DIAGNOSIS — Z5112 Encounter for antineoplastic immunotherapy: Secondary | ICD-10-CM | POA: Diagnosis not present

## 2024-04-25 MED ORDER — PEGFILGRASTIM-CBQV 6 MG/0.6ML ~~LOC~~ SOSY
6.0000 mg | PREFILLED_SYRINGE | Freq: Once | SUBCUTANEOUS | Status: AC
  Administered 2024-04-25: 6 mg via SUBCUTANEOUS
  Filled 2024-04-25: qty 0.6

## 2024-04-26 ENCOUNTER — Other Ambulatory Visit: Payer: Self-pay | Admitting: Pharmacist

## 2024-04-26 DIAGNOSIS — C50512 Malignant neoplasm of lower-outer quadrant of left female breast: Secondary | ICD-10-CM

## 2024-04-28 ENCOUNTER — Other Ambulatory Visit: Payer: Self-pay

## 2024-04-28 ENCOUNTER — Other Ambulatory Visit: Payer: Self-pay | Admitting: Hematology and Oncology

## 2024-04-29 ENCOUNTER — Encounter: Payer: Self-pay | Admitting: Hematology and Oncology

## 2024-05-06 ENCOUNTER — Ambulatory Visit (HOSPITAL_COMMUNITY)

## 2024-05-08 ENCOUNTER — Encounter: Payer: Self-pay | Admitting: *Deleted

## 2024-05-13 MED FILL — Fosaprepitant Dimeglumine For IV Infusion 150 MG (Base Eq): INTRAVENOUS | Qty: 5 | Status: AC

## 2024-05-14 ENCOUNTER — Inpatient Hospital Stay: Payer: Medicare (Managed Care)

## 2024-05-14 ENCOUNTER — Inpatient Hospital Stay (HOSPITAL_BASED_OUTPATIENT_CLINIC_OR_DEPARTMENT_OTHER): Payer: Medicare (Managed Care) | Admitting: Hematology and Oncology

## 2024-05-14 ENCOUNTER — Other Ambulatory Visit: Payer: Self-pay | Admitting: Pharmacist

## 2024-05-14 VITALS — BP 122/60 | HR 69 | Resp 16

## 2024-05-14 VITALS — BP 141/69 | HR 79 | Temp 97.7°F | Resp 18 | Ht 69.0 in | Wt 217.6 lb

## 2024-05-14 DIAGNOSIS — C50512 Malignant neoplasm of lower-outer quadrant of left female breast: Secondary | ICD-10-CM

## 2024-05-14 DIAGNOSIS — Z5112 Encounter for antineoplastic immunotherapy: Secondary | ICD-10-CM | POA: Diagnosis not present

## 2024-05-14 DIAGNOSIS — Z171 Estrogen receptor negative status [ER-]: Secondary | ICD-10-CM

## 2024-05-14 LAB — CMP (CANCER CENTER ONLY)
ALT: 13 U/L (ref 0–44)
AST: 23 U/L (ref 15–41)
Albumin: 4.2 g/dL (ref 3.5–5.0)
Alkaline Phosphatase: 65 U/L (ref 38–126)
Anion gap: 9 (ref 5–15)
BUN: 16 mg/dL (ref 8–23)
CO2: 27 mmol/L (ref 22–32)
Calcium: 9 mg/dL (ref 8.9–10.3)
Chloride: 105 mmol/L (ref 98–111)
Creatinine: 0.55 mg/dL (ref 0.44–1.00)
GFR, Estimated: >60 mL/min
Glucose, Bld: 110 mg/dL — ABNORMAL HIGH (ref 70–99)
Potassium: 3.7 mmol/L (ref 3.5–5.1)
Sodium: 141 mmol/L (ref 135–145)
Total Bilirubin: 0.5 mg/dL (ref 0.0–1.2)
Total Protein: 7.2 g/dL (ref 6.5–8.1)

## 2024-05-14 LAB — CBC WITH DIFFERENTIAL (CANCER CENTER ONLY)
Abs Immature Granulocytes: 0.04 K/uL (ref 0.00–0.07)
Basophils Absolute: 0 K/uL (ref 0.0–0.1)
Basophils Relative: 0 %
Eosinophils Absolute: 0 K/uL (ref 0.0–0.5)
Eosinophils Relative: 0 %
HCT: 27.7 % — ABNORMAL LOW (ref 36.0–46.0)
Hemoglobin: 9.3 g/dL — ABNORMAL LOW (ref 12.0–15.0)
Immature Granulocytes: 1 %
Lymphocytes Relative: 9 %
Lymphs Abs: 0.7 K/uL (ref 0.7–4.0)
MCH: 24 pg — ABNORMAL LOW (ref 26.0–34.0)
MCHC: 33.6 g/dL (ref 30.0–36.0)
MCV: 71.6 fL — ABNORMAL LOW (ref 80.0–100.0)
Monocytes Absolute: 0.3 K/uL (ref 0.1–1.0)
Monocytes Relative: 3 %
Neutro Abs: 6.9 K/uL (ref 1.7–7.7)
Neutrophils Relative %: 87 %
Platelet Count: 130 K/uL — ABNORMAL LOW (ref 150–400)
RBC: 3.87 MIL/uL (ref 3.87–5.11)
RDW: 20.8 % — ABNORMAL HIGH (ref 11.5–15.5)
WBC Count: 7.9 K/uL (ref 4.0–10.5)
nRBC: 0 % (ref 0.0–0.2)

## 2024-05-14 MED ORDER — SODIUM CHLORIDE 0.9 % IV SOLN
580.0000 mg | Freq: Once | INTRAVENOUS | Status: AC
  Administered 2024-05-14: 580 mg via INTRAVENOUS
  Filled 2024-05-14: qty 58

## 2024-05-14 MED ORDER — SODIUM CHLORIDE 0.9 % IV SOLN
200.0000 mg | Freq: Once | INTRAVENOUS | Status: AC
  Administered 2024-05-14: 200 mg via INTRAVENOUS
  Filled 2024-05-14: qty 200

## 2024-05-14 MED ORDER — PALONOSETRON HCL INJECTION 0.25 MG/5ML
0.2500 mg | Freq: Once | INTRAVENOUS | Status: AC
  Administered 2024-05-14: 0.25 mg via INTRAVENOUS
  Filled 2024-05-14: qty 5

## 2024-05-14 MED ORDER — SODIUM CHLORIDE 0.9 % IV SOLN: INTRAVENOUS | Status: DC

## 2024-05-14 MED ORDER — SODIUM CHLORIDE 0.9 % IV SOLN
150.0000 mg | Freq: Once | INTRAVENOUS | Status: AC
  Administered 2024-05-14: 150 mg via INTRAVENOUS
  Filled 2024-05-14: qty 150

## 2024-05-14 MED ORDER — SODIUM CHLORIDE 0.9 % IV SOLN
75.0000 mg/m2 | Freq: Once | INTRAVENOUS | Status: AC
  Administered 2024-05-14: 160 mg via INTRAVENOUS
  Filled 2024-05-14: qty 16

## 2024-05-14 MED ORDER — DEXAMETHASONE SOD PHOSPHATE PF 10 MG/ML IJ SOLN
10.0000 mg | Freq: Once | INTRAMUSCULAR | Status: AC
  Administered 2024-05-14: 10 mg via INTRAVENOUS

## 2024-05-14 NOTE — Patient Instructions (Signed)
 [facility] - A DEPT OF [facility]  Discharge Instructions: Thank you for choosing [facility] to provide your oncology and hematology care.   If you have a lab appointment with the Cancer Center, please go directly to the Cancer Center and check in at the registration area.   Wear comfortable clothing and clothing appropriate for easy access to any Portacath or PICC line.   We strive to give you quality time with your provider. You may need to reschedule your appointment if you arrive late (15 or more minutes).  Arriving late affects you and other patients whose appointments are after yours.  Also, if you miss three or more appointments without notifying the office, you may be dismissed from the clinic at the providers discretion.      For prescription refill requests, have your pharmacy contact our office and allow 72 hours for refills to be completed.    Today you received the following chemotherapy and/or immunotherapy agents: Pembrolizumab  (Keytruda ), Docetaxel  (Taxotere ), and Carboplatin  (Paraplatin )      To help prevent nausea and vomiting after your treatment, we encourage you to take your nausea medication as directed.  BELOW ARE SYMPTOMS THAT SHOULD BE REPORTED IMMEDIATELY: *FEVER GREATER THAN 100.4 F (38 C) OR HIGHER *CHILLS OR SWEATING *NAUSEA AND VOMITING THAT IS NOT CONTROLLED WITH YOUR NAUSEA MEDICATION *UNUSUAL SHORTNESS OF BREATH *UNUSUAL BRUISING OR BLEEDING *URINARY PROBLEMS (pain or burning when urinating, or frequent urination) *BOWEL PROBLEMS (unusual diarrhea, constipation, pain near the anus) TENDERNESS IN MOUTH AND THROAT WITH OR WITHOUT PRESENCE OF ULCERS (sore throat, sores in mouth, or a toothache) UNUSUAL RASH, SWELLING OR PAIN  UNUSUAL VAGINAL DISCHARGE OR ITCHING   Items with * indicate a potential emergency and should be followed up as soon as possible or go to the Emergency Department if any problems should  occur.  Please show the CHEMOTHERAPY ALERT CARD or IMMUNOTHERAPY ALERT CARD at check-in to the Emergency Department and triage nurse.  Should you have questions after your visit or need to cancel or reschedule your appointment, please contact [facility]  Dept: [phone]  and follow the prompts.  Office hours are 8:00 a.m. to 4:30 p.m. Monday - Friday. Please note that voicemails left after 4:00 p.m. may not be returned until the following business day.  We are closed weekends and major holidays. You have access to a nurse at all times for urgent questions. Please call the main number to the clinic Dept: [phone] and follow the prompts.   For any non-urgent questions, you may also contact your provider using MyChart. We now offer e-Visits for anyone 67 and older to request care online for non-urgent symptoms. For details visit [email or web address].   Also download the MyChart app! Go to the app store, search MyChart, open the app, select [place], and log in with your MyChart username and password.

## 2024-05-14 NOTE — Progress Notes (Signed)
 "  Patient Care Team: Iruku, Praveena, MD as PCP - General (Hematology and Oncology) Gerome, Devere HERO, RN as Oncology Nurse Navigator Tyree Nanetta SAILOR, RN as Oncology Nurse Navigator Ebbie Cough, MD as Consulting Physician (General Surgery) Loretha Ash, MD as Consulting Physician (Hematology and Oncology)  DIAGNOSIS:  Encounter Diagnosis  Name Primary?   Malignant neoplasm of lower-outer quadrant of left breast of female, estrogen receptor negative (HCC) Yes    SUMMARY OF ONCOLOGIC HISTORY: Oncology History  Malignant neoplasm of lower-outer quadrant of left breast of female, estrogen receptor negative (HCC)  02/15/2024 Initial Diagnosis   Left breast needle core biopsy, 6 o'clock: IDC grade 3, 3.7cm mass like area.  1 LN biopsied and positive, ER+/PR+/HER2-, Ki-67 50%.    02/15/2024 Cancer Staging   Staging form: Breast, AJCC 8th Edition - Clinical stage from 02/15/2024: Stage IIIB (cT2, cN1, cM0, G3, ER-, PR-, HER2-) - Signed by Crawford Morna Pickle, NP on 03/15/2024 Stage prefix: Initial diagnosis Histologic grading system: 3 grade system   02/27/2024 Imaging   Staging scans with CT chest/abdomen/pelvis and bone scan: negative for metastatic disease.    02/28/2024 Genetic Testing   Negative genetic testing PALB2 c.1833C>A (p.Asp611Glu) VUS POLE c.5942A>G (p.Asn1981Ser) VUS  The Common Hereditary Gene Panel offered by [facility] includes sequencing and/or deletion duplication testing of the following 48 genes: APC, ATM, AXIN2, BAP1, BARD1, BMPR1A, BRCA1, BRCA2, BRIP1, CDH1, CDK4, CDKN2A (p14ARF), CDKN2A (p16INK4a), CHEK2, CTNNA1, DICER1, EPCAM (Deletion/duplication testing only), GREM1 (promoter region deletion/duplication testing only), KIT, MEN1, MLH1, MSH2, MSH3, MSH6, MUTYH, NBN, NF1, NHTL1, PALB2, PDGFRA, PMS2, POLD1, POLE, PTEN, RAD50, RAD51C, RAD51D, SDHB, SDHC, SDHD, SMAD4, SMARCA4. STK11, TP53, TSC1, TSC2, and VHL.  The following genes were evaluated for sequence  changes only: SDHA and HOXB13 c.251G>A variant only.    03/12/2024 - 03/12/2024 Chemotherapy   Patient is on Treatment Plan : BREAST Pembrolizumab  (200) D1 + Carboplatin  (1.5) D1,8,15 + Paclitaxel (80) D1,8,15 q21d X 4 cycles / Pembrolizumab  (200) D1 + AC D1 q21d x 4 cycles     03/12/2024 -  Chemotherapy   Patient is on Treatment Plan : Breast Pembrolizumab  (200) + Docetaxel  (75) + Carboplatin  (AUC 6) q21d x 6 cycles     06/04/2024 -  Chemotherapy   Patient is on Treatment Plan : BREAST Pembrolizumab  (200) D1 + AC D1 q21d x 4 cycles (per Dr. Loretha)       CHIEF COMPLIANT: Cycle 4 Taxotere  carboplatin  pembrolizumab   HISTORY OF PRESENT ILLNESS:  History of Present Illness Whitney Villegas is a 68 year old female with estrogen receptor negative left breast cancer undergoing chemotherapy who presents for follow-up of treatment response and chemotherapy-induced anemia.  She is receiving her fourth cycle of chemotherapy for ER-negative left breast cancer of the lower-outer quadrant. Nausea and weight loss with earlier cycles have improved, with a five-pound weight gain since the last visit. She still has intermittent nausea, triggered by specific foods such as sweet potatoes, okra, cabbage, and ice cream, and notes variable gastrointestinal discomfort distinct from nausea.  Her hemoglobin has decreased from 9.7 to 9.3, and she was recently started on oral iron every other day for chemotherapy-induced anemia. Recent labs show normal renal and hepatic function and a white blood cell count of 7.9.     ALLERGIES:  has no known allergies.  MEDICATIONS:  Current Outpatient Medications  Medication Sig Dispense Refill   cholecalciferol (VITAMIN D3) 25 MCG (1000 UNIT) tablet Take 1,000 Units by mouth daily.     lidocaine -prilocaine  (EMLA ) cream  Apply to affected area once 30 g 3   No current facility-administered medications for this visit.    PHYSICAL EXAMINATION: ECOG PERFORMANCE STATUS: 1 -  Symptomatic but completely ambulatory  Vitals:   05/14/24 0928  BP: (!) 141/69  Pulse: 79  Resp: 18  Temp: 97.7 F (36.5 C)  SpO2: 100%   Filed Weights   05/14/24 0928  Weight: 217 lb 9.6 oz (98.7 kg)    Physical Exam   (exam performed in the presence of a chaperone)  LABORATORY DATA:  I have reviewed the data as listed    Latest Ref Rng & Units 05/14/2024    8:50 AM 04/23/2024    9:59 AM 04/02/2024   11:10 AM  CMP  Glucose 70 - 99 mg/dL 889  92  872   BUN 8 - 23 mg/dL 16  12  17    Creatinine 0.44 - 1.00 mg/dL 9.44  9.45  9.40   Sodium 135 - 145 mmol/L 141  141  139   Potassium 3.5 - 5.1 mmol/L 3.7  3.7  3.8   Chloride 98 - 111 mmol/L 105  105  105   CO2 22 - 32 mmol/L 27  26  27    Calcium 8.9 - 10.3 mg/dL 9.0  8.8  9.1   Total Protein 6.5 - 8.1 g/dL 7.2  7.1  8.0   Total Bilirubin 0.0 - 1.2 mg/dL 0.5  0.5  0.5   Alkaline Phos 38 - 126 U/L 65  72  70   AST 15 - 41 U/L 23  24  17    ALT 0 - 44 U/L 13  15  12      Lab Results  Component Value Date   WBC 7.9 05/14/2024   HGB 9.3 (L) 05/14/2024   HCT 27.7 (L) 05/14/2024   MCV 71.6 (L) 05/14/2024   PLT 130 (L) 05/14/2024   NEUTROABS 6.9 05/14/2024    ASSESSMENT & PLAN:  Malignant neoplasm of lower-outer quadrant of left breast of female, estrogen receptor negative (HCC) 02/15/2024: Left breast needle core biopsy, 6 o'clock: IDC grade 3, 3.7cm mass like area. 1 LN biopsied and positive, ER+/PR+/HER2-, Ki-67 50%   Current treatment: Neoadjuvant chemotherapy with Taxotere  carboplatin  and Keytruda  (Neopact protocol), today cycle 4  Chemotoxicities: Alopecia Anorexia Nausea vomiting and weight loss : Since the last treatment the symptoms have improved.  She has gained 5 pounds Chemo-induced anemia: Monitoring closely.  She was started on iron supplementation.  Return to clinic in 3 weeks for cycle 5   No orders of the defined types were placed in this encounter.  The patient has a good understanding of the  overall plan. she agrees with it. she will call with any problems that may develop before the next visit here.  I personally spent a total of 30 minutes in the care of the patient today including preparing to see the patient, getting/reviewing separately obtained history, performing a medically appropriate exam/evaluation, counseling and educating, placing orders, referring and communicating with other health care professionals, documenting clinical information in the EHR, independently interpreting results, communicating results, and coordinating care.   Viinay K Charlene Detter, MD 05/14/2024    "

## 2024-05-14 NOTE — Assessment & Plan Note (Signed)
 02/15/2024: Left breast needle core biopsy, 6 o'clock: IDC grade 3, 3.7cm mass like area. 1 LN biopsied and positive, ER+/PR+/HER2-, Ki-67 50%   Current treatment: Neoadjuvant chemotherapy with Taxotere  carboplatin  and Keytruda  (Neopact protocol), today cycle 4  Chemotoxicities: Alopecia Anorexia Nausea vomiting and weight loss  Return to clinic in 3 weeks for cycle 5

## 2024-05-15 ENCOUNTER — Other Ambulatory Visit: Payer: Self-pay

## 2024-05-17 ENCOUNTER — Inpatient Hospital Stay: Payer: Medicare (Managed Care)

## 2024-05-17 DIAGNOSIS — Z5112 Encounter for antineoplastic immunotherapy: Secondary | ICD-10-CM | POA: Diagnosis not present

## 2024-05-17 DIAGNOSIS — C50512 Malignant neoplasm of lower-outer quadrant of left female breast: Secondary | ICD-10-CM

## 2024-05-17 MED ORDER — PEGFILGRASTIM-CBQV 6 MG/0.6ML ~~LOC~~ SOSY
6.0000 mg | PREFILLED_SYRINGE | Freq: Once | SUBCUTANEOUS | Status: AC
  Administered 2024-05-17: 6 mg via SUBCUTANEOUS
  Filled 2024-05-17: qty 0.6

## 2024-05-20 ENCOUNTER — Other Ambulatory Visit: Payer: Self-pay | Admitting: Pharmacist

## 2024-05-22 ENCOUNTER — Ambulatory Visit (HOSPITAL_COMMUNITY)
Admission: RE | Admit: 2024-05-22 | Discharge: 2024-05-22 | Disposition: A | Discharge status: Home / Self Care | Source: Ambulatory Visit | Attending: Hematology and Oncology | Admitting: Hematology and Oncology

## 2024-05-22 DIAGNOSIS — I3481 Nonrheumatic mitral (valve) annulus calcification: Secondary | ICD-10-CM

## 2024-05-22 DIAGNOSIS — Z171 Estrogen receptor negative status [ER-]: Secondary | ICD-10-CM | POA: Insufficient documentation

## 2024-05-22 DIAGNOSIS — I517 Cardiomegaly: Secondary | ICD-10-CM | POA: Diagnosis not present

## 2024-05-22 DIAGNOSIS — I358 Other nonrheumatic aortic valve disorders: Secondary | ICD-10-CM | POA: Diagnosis not present

## 2024-05-22 DIAGNOSIS — C50512 Malignant neoplasm of lower-outer quadrant of left female breast: Secondary | ICD-10-CM | POA: Insufficient documentation

## 2024-05-22 LAB — ECHOCARDIOGRAM COMPLETE
Area-P 1/2: 2.62 cm2
Calc EF: 68.4 %
S' Lateral: 2.7 cm
Single Plane A2C EF: 65.3 %
Single Plane A4C EF: 67.9 %

## 2024-05-24 ENCOUNTER — Other Ambulatory Visit: Payer: Self-pay | Admitting: Hematology and Oncology

## 2024-05-28 NOTE — Progress Notes (Signed)
 Pharmacist Chemotherapy Monitoring - Initial Assessment    Anticipated start date: 06/04/24   The following has been reviewed per standard work regarding the patient's treatment regimen: The patient's diagnosis, treatment plan and drug doses, and organ/hematologic function Lab orders and baseline tests specific to treatment regimen  The treatment plan start date, drug sequencing, and pre-medications Prior authorization status  Patient's documented medication list, including drug-drug interaction screen and prescriptions for anti-emetics and supportive care specific to the treatment regimen The drug concentrations, fluid compatibility, administration routes, and timing of the medications to be used The patient's access for treatment and lifetime cumulative dose history, if applicable  The patient's medication allergies and previous infusion related reactions, if applicable   Changes made to treatment plan:  N/A  Follow up needed:  Release of home antiemetics - Zofran  + Dexamethasone  per the treatment plan Pending authorization for treatment  Harlene JONELLE Nasuti, RPH, 05/28/2024  2:01 PM

## 2024-05-29 ENCOUNTER — Other Ambulatory Visit: Payer: Self-pay

## 2024-05-31 ENCOUNTER — Encounter: Payer: Self-pay | Admitting: *Deleted

## 2024-06-03 ENCOUNTER — Encounter: Payer: Self-pay | Admitting: Hematology and Oncology

## 2024-06-03 MED FILL — Fosaprepitant Dimeglumine For IV Infusion 150 MG (Base Eq): INTRAVENOUS | Qty: 5 | Status: AC

## 2024-06-03 NOTE — Progress Notes (Signed)
 The following biosimilar Udenyca  (pegfilgrastim -cbqv) has been selected for use in this patient.  Lamesha Tibbits, Pharm.D., CPP 06/03/2024@9 :38 AM

## 2024-06-04 ENCOUNTER — Inpatient Hospital Stay: Payer: Medicare (Managed Care) | Attending: Hematology and Oncology | Admitting: Hematology and Oncology

## 2024-06-04 ENCOUNTER — Inpatient Hospital Stay: Admitting: Adult Health

## 2024-06-04 ENCOUNTER — Inpatient Hospital Stay

## 2024-06-04 ENCOUNTER — Encounter: Payer: Self-pay | Admitting: Hematology and Oncology

## 2024-06-04 ENCOUNTER — Inpatient Hospital Stay: Payer: Medicare (Managed Care) | Attending: Hematology and Oncology

## 2024-06-04 ENCOUNTER — Inpatient Hospital Stay: Admitting: Hematology and Oncology

## 2024-06-04 ENCOUNTER — Inpatient Hospital Stay: Payer: Medicare (Managed Care)

## 2024-06-04 VITALS — BP 127/65 | HR 78 | Temp 97.8°F | Resp 17 | Wt 209.5 lb

## 2024-06-04 VITALS — BP 122/62 | HR 65 | Temp 98.1°F | Resp 18

## 2024-06-04 DIAGNOSIS — Z171 Estrogen receptor negative status [ER-]: Secondary | ICD-10-CM

## 2024-06-04 DIAGNOSIS — Z17411 Hormone receptor positive with human epidermal growth factor receptor 2 negative status: Secondary | ICD-10-CM | POA: Diagnosis not present

## 2024-06-04 DIAGNOSIS — Z5112 Encounter for antineoplastic immunotherapy: Secondary | ICD-10-CM | POA: Insufficient documentation

## 2024-06-04 DIAGNOSIS — C50512 Malignant neoplasm of lower-outer quadrant of left female breast: Secondary | ICD-10-CM | POA: Diagnosis not present

## 2024-06-04 DIAGNOSIS — Z79632 Long term (current) use of antitumor antibiotic: Secondary | ICD-10-CM | POA: Diagnosis not present

## 2024-06-04 DIAGNOSIS — Z5189 Encounter for other specified aftercare: Secondary | ICD-10-CM | POA: Diagnosis not present

## 2024-06-04 DIAGNOSIS — C50812 Malignant neoplasm of overlapping sites of left female breast: Secondary | ICD-10-CM | POA: Diagnosis not present

## 2024-06-04 DIAGNOSIS — Z5111 Encounter for antineoplastic chemotherapy: Secondary | ICD-10-CM | POA: Insufficient documentation

## 2024-06-04 LAB — CBC WITH DIFFERENTIAL (CANCER CENTER ONLY)
Abs Immature Granulocytes: 0.05 K/uL (ref 0.00–0.07)
Basophils Absolute: 0 K/uL (ref 0.0–0.1)
Basophils Relative: 0 %
Eosinophils Absolute: 0 K/uL (ref 0.0–0.5)
Eosinophils Relative: 0 %
HCT: 27.3 % — ABNORMAL LOW (ref 36.0–46.0)
Hemoglobin: 9.1 g/dL — ABNORMAL LOW (ref 12.0–15.0)
Immature Granulocytes: 1 %
Lymphocytes Relative: 15 %
Lymphs Abs: 1 K/uL (ref 0.7–4.0)
MCH: 24.6 pg — ABNORMAL LOW (ref 26.0–34.0)
MCHC: 33.3 g/dL (ref 30.0–36.0)
MCV: 73.8 fL — ABNORMAL LOW (ref 80.0–100.0)
Monocytes Absolute: 0.4 K/uL (ref 0.1–1.0)
Monocytes Relative: 5 %
Neutro Abs: 5.5 K/uL (ref 1.7–7.7)
Neutrophils Relative %: 79 %
Platelet Count: 131 K/uL — ABNORMAL LOW (ref 150–400)
RBC: 3.7 MIL/uL — ABNORMAL LOW (ref 3.87–5.11)
RDW: 21.9 % — ABNORMAL HIGH (ref 11.5–15.5)
WBC Count: 7 K/uL (ref 4.0–10.5)
nRBC: 0 % (ref 0.0–0.2)

## 2024-06-04 LAB — CMP (CANCER CENTER ONLY)
ALT: 13 U/L (ref 0–44)
AST: 21 U/L (ref 15–41)
Albumin: 4 g/dL (ref 3.5–5.0)
Alkaline Phosphatase: 62 U/L (ref 38–126)
Anion gap: 11 (ref 5–15)
BUN: 17 mg/dL (ref 8–23)
CO2: 26 mmol/L (ref 22–32)
Calcium: 9.2 mg/dL (ref 8.9–10.3)
Chloride: 104 mmol/L (ref 98–111)
Creatinine: 0.55 mg/dL (ref 0.44–1.00)
GFR, Estimated: >60 mL/min
Glucose, Bld: 114 mg/dL — ABNORMAL HIGH (ref 70–99)
Potassium: 3.7 mmol/L (ref 3.5–5.1)
Sodium: 140 mmol/L (ref 135–145)
Total Bilirubin: 0.5 mg/dL (ref 0.0–1.2)
Total Protein: 7.3 g/dL (ref 6.5–8.1)

## 2024-06-04 MED ORDER — SODIUM CHLORIDE 0.9 % IV SOLN
150.0000 mg | Freq: Once | INTRAVENOUS | Status: AC
  Administered 2024-06-04: 150 mg via INTRAVENOUS
  Filled 2024-06-04: qty 150

## 2024-06-04 MED ORDER — PALONOSETRON HCL INJECTION 0.25 MG/5ML
0.2500 mg | Freq: Once | INTRAVENOUS | Status: AC
  Administered 2024-06-04: 0.25 mg via INTRAVENOUS
  Filled 2024-06-04: qty 5

## 2024-06-04 MED ORDER — SODIUM CHLORIDE 0.9 % IV SOLN
200.0000 mg | Freq: Once | INTRAVENOUS | Status: AC
  Administered 2024-06-04: 200 mg via INTRAVENOUS
  Filled 2024-06-04: qty 200

## 2024-06-04 MED ORDER — DOXORUBICIN HCL CHEMO IV INJECTION 2 MG/ML
48.0000 mg/m2 | Freq: Once | INTRAVENOUS | Status: AC
  Administered 2024-06-04: 106 mg via INTRAVENOUS
  Filled 2024-06-04: qty 53

## 2024-06-04 MED ORDER — SODIUM CHLORIDE 0.9 % IV SOLN
480.0000 mg/m2 | Freq: Once | INTRAVENOUS | Status: AC
  Administered 2024-06-04: 1000 mg via INTRAVENOUS
  Filled 2024-06-04: qty 50

## 2024-06-04 MED ORDER — DEXAMETHASONE SOD PHOSPHATE PF 10 MG/ML IJ SOLN
10.0000 mg | Freq: Once | INTRAMUSCULAR | Status: AC
  Administered 2024-06-04: 10 mg via INTRAVENOUS
  Filled 2024-06-04: qty 1

## 2024-06-04 MED ORDER — SODIUM CHLORIDE 0.9 % IV SOLN: INTRAVENOUS | Status: DC

## 2024-06-04 NOTE — Progress Notes (Signed)
 "  Patient Care Team: Miria Cappelli, MD as PCP - General (Hematology and Oncology) Gerome, Devere HERO, RN as Oncology Nurse Navigator Tyree Nanetta SAILOR, RN as Oncology Nurse Navigator Ebbie Cough, MD as Consulting Physician (General Surgery) Loretha Ash, MD as Consulting Physician (Hematology and Oncology)  DIAGNOSIS:  No diagnosis found.   SUMMARY OF ONCOLOGIC HISTORY: Oncology History  Malignant neoplasm of lower-outer quadrant of left breast of female, estrogen receptor negative (HCC)  02/15/2024 Initial Diagnosis   Left breast needle core biopsy, 6 o'clock: IDC grade 3, 3.7cm mass like area.  1 LN biopsied and positive, ER+/PR+/HER2-, Ki-67 50%.    02/15/2024 Cancer Staging   Staging form: Breast, AJCC 8th Edition - Clinical stage from 02/15/2024: Stage IIIB (cT2, cN1, cM0, G3, ER-, PR-, HER2-) - Signed by Crawford Morna Pickle, NP on 03/15/2024 Stage prefix: Initial diagnosis Histologic grading system: 3 grade system   02/27/2024 Imaging   Staging scans with CT chest/abdomen/pelvis and bone scan: negative for metastatic disease.    02/28/2024 Genetic Testing   Negative genetic testing PALB2 c.1833C>A (p.Asp611Glu) VUS POLE c.5942A>G (p.Asn1981Ser) VUS  The Common Hereditary Gene Panel offered by [facility] includes sequencing and/or deletion duplication testing of the following 48 genes: APC, ATM, AXIN2, BAP1, BARD1, BMPR1A, BRCA1, BRCA2, BRIP1, CDH1, CDK4, CDKN2A (p14ARF), CDKN2A (p16INK4a), CHEK2, CTNNA1, DICER1, EPCAM (Deletion/duplication testing only), GREM1 (promoter region deletion/duplication testing only), KIT, MEN1, MLH1, MSH2, MSH3, MSH6, MUTYH, NBN, NF1, NHTL1, PALB2, PDGFRA, PMS2, POLD1, POLE, PTEN, RAD50, RAD51C, RAD51D, SDHB, SDHC, SDHD, SMAD4, SMARCA4. STK11, TP53, TSC1, TSC2, and VHL.  The following genes were evaluated for sequence changes only: SDHA and HOXB13 c.251G>A variant only.    03/12/2024 - 03/12/2024 Chemotherapy   Patient is on Treatment Plan  : BREAST Pembrolizumab  (200) D1 + Carboplatin  (1.5) D1,8,15 + Paclitaxel (80) D1,8,15 q21d X 4 cycles / Pembrolizumab  (200) D1 + AC D1 q21d x 4 cycles     03/12/2024 - 05/17/2024 Chemotherapy   Patient is on Treatment Plan : Breast Pembrolizumab  (200) + Docetaxel  (75) + Carboplatin  (AUC 6) q21d x 6 cycles     06/04/2024 -  Chemotherapy   Patient is on Treatment Plan : BREAST Pembrolizumab  (200) D1 + AC D1 q21d x 4 cycles (per Dr. Loretha)       CHIEF COMPLIANT: Cycle 4 AC Keytruda   HISTORY OF PRESENT ILLNESS:  History of Present Illness  Whitney Villegas is a 69 year old female with breast cancer undergoing neoadjuvant chemotherapy who presents for follow-up and transition to the next phase of treatment, AC-Keytruda .  So far she has tolerated the chemotherapy very well. She has experienced a 30-pound weight loss, now weighing 209 lbs, and describes fluctuating appetite. Nausea has been manageable, and she feels generally well.  She denies abnormal bleeding, bruising, or new palpable masses.  She expresses concern regarding potential increased fatigue and its impact on her ability to work, as well as the possibility of requiring mastectomy after chemotherapy.   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 Rest of the pertinent 10 point ROS reviewed and neg.  ALLERGIES:  has no known allergies.  MEDICATIONS:  Current Outpatient Medications  Medication Sig Dispense Refill   cholecalciferol (VITAMIN D3) 25 MCG (1000 UNIT) tablet Take 1,000 Units by mouth daily.     lidocaine -prilocaine  (EMLA ) cream Apply to affected area once 30 g 3   No current facility-administered medications for this visit.    PHYSICAL EXAMINATION: ECOG PERFORMANCE STATUS:  1 - Symptomatic but completely ambulatory  Vitals:   06/04/24 1040  BP: 127/65  Pulse: 78  Resp: 17  Temp: 97.8 F (36.6 C)  SpO2: 100%   Filed Weights   06/04/24 1040   Weight: 209 lb 8 oz (95 kg)    Physical Exam  BP 127/65 (BP Location: Left Arm, Patient Position: Sitting)   Pulse 78   Temp 97.8 F (36.6 C) (Temporal)   Resp 17   Wt 209 lb 8 oz (95 kg)   SpO2 100%   BMI 30.94 kg/m   GENERAL: Alert, cooperative, well developed, no acute distress. HEENT: Normocephalic, normal oropharynx, moist mucous membranes. CHEST: Clear to auscultation bilaterally, no wheezes, rhonchi, or crackles. CARDIOVASCULAR: Normal heart rate and rhythm, S1 and S2 normal without murmurs. BREAST: Breast with ongoing thickened skin on lower half, improvement noted. ABDOMEN: Soft, non-tender, non-distended, without organomegaly, normal bowel sounds. EXTREMITIES: No cyanosis or edema. NEUROLOGICAL: Cranial nerves grossly intact, moves all extremities without gross motor or sensory deficit.    LABORATORY DATA:  I have reviewed the data as listed    Latest Ref Rng & Units 05/14/2024    8:50 AM 04/23/2024    9:59 AM 04/02/2024   11:10 AM  CMP  Glucose 70 - 99 mg/dL 889  92  872   BUN 8 - 23 mg/dL 16  12  17    Creatinine 0.44 - 1.00 mg/dL 9.44  9.45  9.40   Sodium 135 - 145 mmol/L 141  141  139   Potassium 3.5 - 5.1 mmol/L 3.7  3.7  3.8   Chloride 98 - 111 mmol/L 105  105  105   CO2 22 - 32 mmol/L 27  26  27    Calcium 8.9 - 10.3 mg/dL 9.0  8.8  9.1   Total Protein 6.5 - 8.1 g/dL 7.2  7.1  8.0   Total Bilirubin 0.0 - 1.2 mg/dL 0.5  0.5  0.5   Alkaline Phos 38 - 126 U/L 65  72  70   AST 15 - 41 U/L 23  24  17    ALT 0 - 44 U/L 13  15  12      Lab Results  Component Value Date   WBC 7.9 05/14/2024   HGB 9.3 (L) 05/14/2024   HCT 27.7 (L) 05/14/2024   MCV 71.6 (L) 05/14/2024   PLT 130 (L) 05/14/2024   NEUTROABS 6.9 05/14/2024    ASSESSMENT & PLAN:  Malignant neoplasm of lower-outer quadrant of left breast of female, estrogen receptor negative (HCC) 02/15/2024: Left breast needle core biopsy, 6 o'clock: IDC grade 3, 3.7cm mass like area. 1 LN biopsied and  positive, ER+/PR+/HER2-, Ki-67 50%   Current treatment: Neoadjuvant chemotherapy with Taxotere  carboplatin  and Keytruda  (Neopact protocol), today cycle 4  Assessment and Plan Assessment & Plan Breast cancer Good response to NeoPACT chemotherapy. Keynote 522 protocol deemed optimal.   - Initiate AC Keytruda , four cycles every 21 days. - Monitor for side effects including fatigue and nausea; offer dose reduction if toxicity is intolerable. - Assess breast for clinical response; residual thickening persists despite improvement. - Discuss need for surgical intervention post-chemotherapy, possible mastectomy based on evaluation. - Discuss breast reconstruction if mastectomy required. - Schedule follow-up visit prior to next chemotherapy cycle. - Confirm appointment for supportive care injection two days post-visit.   No orders of the defined types were placed in this encounter.  The patient has a good understanding of the overall plan. she agrees with it.  she will call with any problems that may develop before the next visit here.   I personally spent a total of 30 minutes in the care of the patient today including preparing to see the patient, getting/reviewing separately obtained history, performing a medically appropriate exam/evaluation, counseling and educating, placing orders, referring and communicating with other health care professionals, documenting clinical information in the EHR, independently interpreting results, communicating results, and coordinating care.   Amber Stalls, MD 06/04/2024    "

## 2024-06-04 NOTE — Patient Instructions (Addendum)
 [facility] - A DEPT OF [facility]  Discharge Instructions: Thank you for choosing [facility] to provide your oncology and hematology care.   If you have a lab appointment with the Cancer Center, please go directly to the Cancer Center and check in at the registration area.   Wear comfortable clothing and clothing appropriate for easy access to any Portacath or PICC line.   We strive to give you quality time with your provider. You may need to reschedule your appointment if you arrive late (15 or more minutes).  Arriving late affects you and other patients whose appointments are after yours.  Also, if you miss three or more appointments without notifying the office, you may be dismissed from the clinic at the provider's discretion.      For prescription refill requests, have your pharmacy contact our office and allow 72 hours for refills to be completed.    Today you received the following chemotherapy and/or immunotherapy agents: Pembrolizumab  (Keytruda ), Doxorubicin  (Adriamycin ), and Cyclophosphamide  (Cytoxan )   To help prevent nausea and vomiting after your treatment, we encourage you to take your nausea medication as directed.  BELOW ARE SYMPTOMS THAT SHOULD BE REPORTED IMMEDIATELY: *FEVER GREATER THAN 100.4 F (38 C) OR HIGHER *CHILLS OR SWEATING *NAUSEA AND VOMITING THAT IS NOT CONTROLLED WITH YOUR NAUSEA MEDICATION *UNUSUAL SHORTNESS OF BREATH *UNUSUAL BRUISING OR BLEEDING *URINARY PROBLEMS (pain or burning when urinating, or frequent urination) *BOWEL PROBLEMS (unusual diarrhea, constipation, pain near the anus) TENDERNESS IN MOUTH AND THROAT WITH OR WITHOUT PRESENCE OF ULCERS (sore throat, sores in mouth, or a toothache) UNUSUAL RASH, SWELLING OR PAIN  UNUSUAL VAGINAL DISCHARGE OR ITCHING   Items with * indicate a potential emergency and should be followed up as soon as possible or go to the Emergency Department if any problems  should occur.  Please show the CHEMOTHERAPY ALERT CARD or IMMUNOTHERAPY ALERT CARD at check-in to the Emergency Department and triage nurse.  Should you have questions after your visit or need to cancel or reschedule your appointment, please contact [facility] - A DEPT OF [facility]  Dept: [phone]  and follow the prompts.  Office hours are 8:00 a.m. to 4:30 p.m. Monday - Friday. Please note that voicemails left after 4:00 p.m. may not be returned until the following business day.  We are closed weekends and major holidays. You have access to a nurse at all times for urgent questions. Please call the main number to the clinic Dept: [phone] and follow the prompts.   For any non-urgent questions, you may also contact your provider using MyChart. We now offer e-Visits for anyone 50 and older to request care online for non-urgent symptoms. For details visit [email or web address].   Also download the MyChart app! Go to the app store, search MyChart, open the app, select [place], and log in with your MyChart username and password.

## 2024-06-06 ENCOUNTER — Inpatient Hospital Stay: Payer: Medicare (Managed Care)

## 2024-06-06 VITALS — BP 133/69 | HR 82 | Temp 97.9°F | Resp 18

## 2024-06-06 DIAGNOSIS — Z5112 Encounter for antineoplastic immunotherapy: Secondary | ICD-10-CM | POA: Diagnosis not present

## 2024-06-06 DIAGNOSIS — Z171 Estrogen receptor negative status [ER-]: Secondary | ICD-10-CM

## 2024-06-06 MED ORDER — PEGFILGRASTIM-CBQV 6 MG/0.6ML ~~LOC~~ SOSY
6.0000 mg | PREFILLED_SYRINGE | Freq: Once | SUBCUTANEOUS | Status: AC
  Administered 2024-06-06: 6 mg via SUBCUTANEOUS
  Filled 2024-06-06: qty 0.6

## 2024-06-17 ENCOUNTER — Other Ambulatory Visit: Payer: Self-pay

## 2024-06-25 ENCOUNTER — Encounter: Payer: Self-pay | Admitting: *Deleted

## 2024-06-25 ENCOUNTER — Inpatient Hospital Stay: Payer: Medicare (Managed Care) | Attending: Hematology and Oncology | Admitting: Hematology and Oncology

## 2024-06-25 ENCOUNTER — Inpatient Hospital Stay: Payer: Medicare (Managed Care)

## 2024-06-25 ENCOUNTER — Other Ambulatory Visit: Payer: Self-pay

## 2024-06-25 VITALS — BP 136/66 | HR 76 | Temp 98.1°F | Resp 17 | Wt 208.0 lb

## 2024-06-25 DIAGNOSIS — C50512 Malignant neoplasm of lower-outer quadrant of left female breast: Secondary | ICD-10-CM

## 2024-06-25 DIAGNOSIS — Z171 Estrogen receptor negative status [ER-]: Secondary | ICD-10-CM

## 2024-06-25 DIAGNOSIS — T451X5A Adverse effect of antineoplastic and immunosuppressive drugs, initial encounter: Secondary | ICD-10-CM | POA: Diagnosis not present

## 2024-06-25 DIAGNOSIS — G620 Drug-induced polyneuropathy: Secondary | ICD-10-CM

## 2024-06-25 DIAGNOSIS — Z95828 Presence of other vascular implants and grafts: Secondary | ICD-10-CM

## 2024-06-25 LAB — CMP (CANCER CENTER ONLY)
ALT: 12 U/L (ref 0–44)
AST: 21 U/L (ref 15–41)
Albumin: 4.2 g/dL (ref 3.5–5.0)
Alkaline Phosphatase: 71 U/L (ref 38–126)
Anion gap: 10 (ref 5–15)
BUN: 15 mg/dL (ref 8–23)
CO2: 27 mmol/L (ref 22–32)
Calcium: 9.2 mg/dL (ref 8.9–10.3)
Chloride: 104 mmol/L (ref 98–111)
Creatinine: 0.53 mg/dL (ref 0.44–1.00)
GFR, Estimated: >60 mL/min
Glucose, Bld: 115 mg/dL — ABNORMAL HIGH (ref 70–99)
Potassium: 3.4 mmol/L — ABNORMAL LOW (ref 3.5–5.1)
Sodium: 141 mmol/L (ref 135–145)
Total Bilirubin: 0.5 mg/dL (ref 0.0–1.2)
Total Protein: 7.3 g/dL (ref 6.5–8.1)

## 2024-06-25 LAB — CBC WITH DIFFERENTIAL (CANCER CENTER ONLY)
Abs Immature Granulocytes: 0.11 10*3/uL — ABNORMAL HIGH (ref 0.00–0.07)
Basophils Absolute: 0 10*3/uL (ref 0.0–0.1)
Basophils Relative: 0 %
Eosinophils Absolute: 0 10*3/uL (ref 0.0–0.5)
Eosinophils Relative: 0 %
HCT: 28.2 % — ABNORMAL LOW (ref 36.0–46.0)
Hemoglobin: 9.2 g/dL — ABNORMAL LOW (ref 12.0–15.0)
Immature Granulocytes: 1 %
Lymphocytes Relative: 8 %
Lymphs Abs: 0.7 10*3/uL (ref 0.7–4.0)
MCH: 25.3 pg — ABNORMAL LOW (ref 26.0–34.0)
MCHC: 32.6 g/dL (ref 30.0–36.0)
MCV: 77.7 fL — ABNORMAL LOW (ref 80.0–100.0)
Monocytes Absolute: 0.3 10*3/uL (ref 0.1–1.0)
Monocytes Relative: 3 %
Neutro Abs: 7.6 10*3/uL (ref 1.7–7.7)
Neutrophils Relative %: 88 %
Platelet Count: 224 10*3/uL (ref 150–400)
RBC: 3.63 MIL/uL — ABNORMAL LOW (ref 3.87–5.11)
RDW: 22.3 % — ABNORMAL HIGH (ref 11.5–15.5)
WBC Count: 8.7 10*3/uL (ref 4.0–10.5)
nRBC: 0 % (ref 0.0–0.2)

## 2024-06-25 LAB — TSH: TSH: 0.274 u[IU]/mL — ABNORMAL LOW (ref 0.350–4.500)

## 2024-06-25 LAB — FERRITIN: Ferritin: 419 ng/mL — ABNORMAL HIGH (ref 11–307)

## 2024-06-25 MED ORDER — SODIUM CHLORIDE 0.9 % IV SOLN
200.0000 mg | Freq: Once | INTRAVENOUS | Status: AC
  Administered 2024-06-25: 200 mg via INTRAVENOUS
  Filled 2024-06-25: qty 200

## 2024-06-25 MED ORDER — SODIUM CHLORIDE 0.9 % IV SOLN
480.0000 mg/m2 | Freq: Once | INTRAVENOUS | Status: AC
  Administered 2024-06-25: 1000 mg via INTRAVENOUS
  Filled 2024-06-25: qty 50

## 2024-06-25 MED ORDER — SODIUM CHLORIDE 0.9 % IV SOLN
150.0000 mg | Freq: Once | INTRAVENOUS | Status: AC
  Administered 2024-06-25: 150 mg via INTRAVENOUS
  Filled 2024-06-25: qty 150

## 2024-06-25 MED ORDER — SODIUM CHLORIDE 0.9 % IV SOLN: INTRAVENOUS | Status: DC

## 2024-06-25 MED ORDER — SODIUM CHLORIDE 0.9% FLUSH: 10.0000 mL | INTRAVENOUS | Status: DC | PRN

## 2024-06-25 MED ORDER — DOXORUBICIN HCL CHEMO IV INJECTION 2 MG/ML
48.0000 mg/m2 | Freq: Once | INTRAVENOUS | Status: AC
  Administered 2024-06-25: 106 mg via INTRAVENOUS
  Filled 2024-06-25: qty 53

## 2024-06-25 MED ORDER — PALONOSETRON HCL INJECTION 0.25 MG/5ML
0.2500 mg | Freq: Once | INTRAVENOUS | Status: AC
  Administered 2024-06-25: 0.25 mg via INTRAVENOUS
  Filled 2024-06-25: qty 5

## 2024-06-25 MED ORDER — DEXAMETHASONE SOD PHOSPHATE PF 10 MG/ML IJ SOLN
10.0000 mg | Freq: Once | INTRAMUSCULAR | Status: AC
  Administered 2024-06-25: 10 mg via INTRAVENOUS
  Filled 2024-06-25: qty 1

## 2024-06-26 LAB — T4: T4, Total: 8.8 ug/dL (ref 4.5–12.0)

## 2024-06-27 ENCOUNTER — Inpatient Hospital Stay: Payer: Medicare (Managed Care)

## 2024-06-27 VITALS — BP 132/72 | HR 85 | Resp 17

## 2024-06-27 DIAGNOSIS — C50512 Malignant neoplasm of lower-outer quadrant of left female breast: Secondary | ICD-10-CM

## 2024-06-27 MED ORDER — PEGFILGRASTIM-CBQV 6 MG/0.6ML ~~LOC~~ SOSY
6.0000 mg | PREFILLED_SYRINGE | Freq: Once | SUBCUTANEOUS | Status: AC
  Administered 2024-06-27: 6 mg via SUBCUTANEOUS
  Filled 2024-06-27: qty 0.6

## 2024-07-17 ENCOUNTER — Inpatient Hospital Stay: Payer: Medicare (Managed Care) | Admitting: Hematology and Oncology

## 2024-07-17 ENCOUNTER — Inpatient Hospital Stay: Payer: Medicare (Managed Care) | Attending: Hematology and Oncology

## 2024-07-17 ENCOUNTER — Inpatient Hospital Stay: Payer: Medicare (Managed Care)

## 2024-07-19 ENCOUNTER — Inpatient Hospital Stay: Payer: Medicare (Managed Care)
